# Patient Record
Sex: Female | Born: 1955 | Race: White | Hispanic: No | Marital: Single | Smoking: Current every day smoker
Health system: Southern US, Community
[De-identification: ages and names within clinical notes are randomized; demographics above are authoritative.]

## PROBLEM LIST (undated history)

## (undated) DIAGNOSIS — J439 Emphysema, unspecified: Secondary | ICD-10-CM

## (undated) DIAGNOSIS — J45909 Unspecified asthma, uncomplicated: Secondary | ICD-10-CM

## (undated) DIAGNOSIS — K219 Gastro-esophageal reflux disease without esophagitis: Secondary | ICD-10-CM

## (undated) DIAGNOSIS — F419 Anxiety disorder, unspecified: Secondary | ICD-10-CM

## (undated) DIAGNOSIS — G2581 Restless legs syndrome: Secondary | ICD-10-CM

## (undated) DIAGNOSIS — R0902 Hypoxemia: Secondary | ICD-10-CM

## (undated) DIAGNOSIS — Z860100 Personal history of colon polyps, unspecified: Secondary | ICD-10-CM

## (undated) DIAGNOSIS — J449 Chronic obstructive pulmonary disease, unspecified: Secondary | ICD-10-CM

## (undated) DIAGNOSIS — D219 Benign neoplasm of connective and other soft tissue, unspecified: Secondary | ICD-10-CM

## (undated) DIAGNOSIS — IMO0001 Reserved for inherently not codable concepts without codable children: Secondary | ICD-10-CM

## (undated) DIAGNOSIS — G709 Myoneural disorder, unspecified: Secondary | ICD-10-CM

## (undated) HISTORY — DX: Personal history of colon polyps, unspecified: Z86.0100

## (undated) HISTORY — DX: Myoneural disorder, unspecified: G70.9

## (undated) HISTORY — DX: Anxiety disorder, unspecified: F41.9

## (undated) HISTORY — DX: Benign neoplasm of connective and other soft tissue, unspecified: D21.9

## (undated) HISTORY — DX: Emphysema, unspecified: J43.9

## (undated) HISTORY — DX: Reserved for inherently not codable concepts without codable children: IMO0001

## (undated) HISTORY — DX: Hypoxemia: R09.02

## (undated) HISTORY — DX: Gastro-esophageal reflux disease without esophagitis: K21.9

## (undated) HISTORY — DX: Restless legs syndrome: G25.81

## (undated) HISTORY — DX: Chronic obstructive pulmonary disease, unspecified: J44.9

## (undated) HISTORY — DX: Unspecified asthma, uncomplicated: J45.909

## (undated) HISTORY — DX: Personal history of colonic polyps: Z86.010

---

## 1981-12-28 HISTORY — PX: TUBAL LIGATION: SHX77

## 2005-01-21 ENCOUNTER — Ambulatory Visit: Payer: Self-pay | Admitting: Gastroenterology

## 2005-02-02 ENCOUNTER — Ambulatory Visit: Payer: Self-pay | Admitting: Obstetrics and Gynecology

## 2005-07-08 ENCOUNTER — Ambulatory Visit: Payer: Self-pay

## 2006-02-16 ENCOUNTER — Ambulatory Visit: Payer: Self-pay | Admitting: Family Medicine

## 2007-02-25 ENCOUNTER — Ambulatory Visit: Payer: Self-pay | Admitting: Family Medicine

## 2007-03-01 ENCOUNTER — Ambulatory Visit: Payer: Self-pay | Admitting: Family Medicine

## 2008-03-24 ENCOUNTER — Ambulatory Visit: Payer: Self-pay | Admitting: Internal Medicine

## 2008-08-15 ENCOUNTER — Ambulatory Visit: Payer: Self-pay | Admitting: Family Medicine

## 2008-09-11 ENCOUNTER — Ambulatory Visit: Payer: Self-pay | Admitting: Family Medicine

## 2008-12-28 HISTORY — PX: CHOLECYSTECTOMY: SHX55

## 2010-05-21 ENCOUNTER — Ambulatory Visit: Payer: Self-pay | Admitting: Family Medicine

## 2010-06-18 ENCOUNTER — Ambulatory Visit: Payer: Self-pay | Admitting: Surgery

## 2011-03-03 ENCOUNTER — Ambulatory Visit: Payer: Self-pay | Admitting: Family Medicine

## 2011-03-05 ENCOUNTER — Ambulatory Visit: Payer: Self-pay | Admitting: Family Medicine

## 2011-04-22 LAB — HM COLONOSCOPY

## 2013-10-22 ENCOUNTER — Ambulatory Visit: Payer: Self-pay | Admitting: Internal Medicine

## 2014-01-31 ENCOUNTER — Institutional Professional Consult (permissible substitution): Payer: Self-pay | Admitting: Pulmonary Disease

## 2014-02-26 ENCOUNTER — Encounter: Payer: Self-pay | Admitting: Pulmonary Disease

## 2014-02-26 ENCOUNTER — Ambulatory Visit (INDEPENDENT_AMBULATORY_CARE_PROVIDER_SITE_OTHER): Payer: BC Managed Care – PPO | Admitting: Pulmonary Disease

## 2014-02-26 VITALS — BP 128/76 | Temp 98.5°F | Ht 62.0 in | Wt 152.0 lb

## 2014-02-26 DIAGNOSIS — R05 Cough: Secondary | ICD-10-CM

## 2014-02-26 DIAGNOSIS — J4489 Other specified chronic obstructive pulmonary disease: Secondary | ICD-10-CM

## 2014-02-26 DIAGNOSIS — F17200 Nicotine dependence, unspecified, uncomplicated: Secondary | ICD-10-CM

## 2014-02-26 DIAGNOSIS — J449 Chronic obstructive pulmonary disease, unspecified: Secondary | ICD-10-CM | POA: Insufficient documentation

## 2014-02-26 DIAGNOSIS — R059 Cough, unspecified: Secondary | ICD-10-CM

## 2014-02-26 DIAGNOSIS — Z720 Tobacco use: Secondary | ICD-10-CM

## 2014-02-26 MED ORDER — NICOTINE 10 MG IN INHA: 1.0000 | RESPIRATORY_TRACT | Status: DC | PRN

## 2014-02-26 NOTE — Assessment & Plan Note (Signed)
COPD, ongoing tobacco use, and postnasal drip or often contributing to her cough. The best thing she could do for herself is to quit smoking.  Plan: -Add Nasacort -Add over-the-counter decongestant -Add Milta Deiters med saline rinses -Quit smoking

## 2014-02-26 NOTE — Patient Instructions (Signed)
QUIT SMOKING! Use the nicotrol inhaler to help you quit  Use Milta Deiters Med rinses with distilled water at least twice per day using the instructions on the package. 1/2 hour after using the [facility] Med rinse, use Nasacort two puffs in each nostril once per day. Use Claritin and an over the counter decongestant (phenylephrine or pseudophed) as needed for the cough.  Use delsym at night prior to bedtime. During the day, use hard candies to try to soothe your throat Avoid clearing your throat as much as possible  Use your Spiriva and Dulera with a spacer  We will see you back in 6-8 weeks or sooner if needed

## 2014-02-26 NOTE — Assessment & Plan Note (Signed)
She and I talked about this at length today. The entire reason why she is seeing me today is because of her tobacco use. She has COPD and chronic cough which are both to her ongoing smoking. The best thing she can do to help herself is to quit smoking. However, she doesn't seem to be very motivated. She was interested in considering Nicotrol inhaler use after discussion today.  Plan: -Nicotrol inhaler -Continue to encourage her to quit

## 2014-02-26 NOTE — Assessment & Plan Note (Signed)
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 This is very concerning and is a good explanation for why she has been feeling more short of breath in the last year. I explained to her that repetitive exacerbations will lead to worsening COPD and I believe this is clearly the case.  She desperately needs to quit smoking, as this is only accelerating decline of her COPD and causing the exacerbations.  Plan: -O2 therapy: Not indicated -Immunizations: Review next visit -Tobacco use: Advised at length to quit -Exercise: Encouraged regular exercise -Bronchodilator therapy: Add a spacer for Dulera, continue Spiriva -Exacerbation prevention: Quit smoking

## 2014-02-26 NOTE — Progress Notes (Signed)
Subjective:    Patient ID: Lisa Avila, female    DOB: 06-May-1956, 58 y.o.   MRN: [id number]  HPI  Lisa Avila is here to see me for COPD.  She wa previously followed by Dr. Vella Kohler for this several years.  She has known about this for about ten year.   In the last few months she has had a lot cough at night and has been treated with multiple rounds of antibiotics since January.  As a child she had asthma which was exacerbated by pine trees.  She has never been hospitalizations.  She has some constant sinus drainage drainage.  In years past this was worse in the Spring, but lately it has been bad.  She takes claritin on an as needed basis for her sinus drainage.  She doesn't take anything else for the sinus trouble.  She rarely coughs during the day, but mostly at night.  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  When she is in the plant she gets more dyspnea after being exposed to the dust.  2014 was a rough year in terms of her breathing.  She was treated for multiple flares with prednisone and antibiotics.   In general her dyspnea has not changed much lately, just the cough.  She saw a chiropracter a few numbness in her shoulder and had an adjustment.   She continues to smoke 1.5 packs per day.  She continues has tried Chantix which made her irritable after one week of use.    She remains active despite her COPD, but she noticed more dyspnea lasat year after multiple exacerbations.    She takes ranitidine at night and doesn't have much heart burn.  Past Medical History  Diagnosis Date  . Asthma   . Emphysema lung   . COPD (chronic obstructive pulmonary disease)   . Restless leg syndrome   . History of colonic polyps   . Fibroids   . Reflux   . Anxiety      Family History  Problem Relation Age of Onset  . Emphysema Maternal Grandmother   . Lung cancer Paternal Grandfather    . Asthma Paternal Grandmother      History   Social History  . Marital Status: Single    Spouse Name: N/A    Number of Children: N/A  . Years of Education: N/A   Occupational History  . Not on file.   Social History Main Topics  . Smoking status: Former Smoker -- 1.50 packs/day for 44 years    Types: Cigarettes  . Smokeless tobacco: Never Used     Comment: tried eCig, made coughing worse.  Marland Kitchen Alcohol Use: No  . Drug Use: No  . Sexual Activity: Not on file   Other Topics Concern  . Not on file   Social History Narrative  . No narrative on file     Not on File   No outpatient prescriptions prior to visit.   No facility-administered medications prior to visit.      Review of Systems  Constitutional: Negative for fever and unexpected weight change.  HENT: Negative for congestion, dental problem, ear pain, nosebleeds, postnasal drip, rhinorrhea, sinus pressure, sneezing, sore throat and trouble swallowing.   Eyes: Negative for redness and itching.  Respiratory: Positive for cough and shortness of breath. Negative for chest tightness and wheezing.   Cardiovascular: Negative  for palpitations and leg swelling.  Gastrointestinal: Negative for nausea and vomiting.  Genitourinary: Negative for dysuria.  Musculoskeletal: Negative for joint swelling.  Skin: Negative for rash.  Neurological: Negative for headaches.  Hematological: Does not bruise/bleed easily.  Psychiatric/Behavioral: Negative for dysphoric mood. The patient is not nervous/anxious.        Objective:   Physical Exam  Filed Vitals:   02/26/14 1332  BP: 128/76  Temp: 98.5 F (36.9 C)  TempSrc: Oral  Height: 5\' 2"  (1.575 m)  Weight: 152 lb (68.947 kg)   Gen: well appearing, no acute distress HEENT: NCAT, PERRL, EOMi, OP clear, neck supple without masses PULM: CTA B CV: RRR, no mgr, no JVD AB: BS+, soft, nontender, no hsm Ext: warm, no edema, no clubbing, no cyanosis Derm: no rash or skin  breakdown Neuro: A&Ox4, CN II-XII intact, strength 5/5 in all 4 extremities       Assessment & Plan:   COPD, moderate Lisa Avila has gold grade D. COPD and that she has had more than 2 exacerbations in the last year. This is very concerning and is a good explanation for why she has been feeling more short of breath in the last year. I explained to her that repetitive exacerbations will lead to worsening COPD and I believe this is clearly the case.  She desperately needs to quit smoking, as this is only accelerating decline of her COPD and causing the exacerbations.  Plan: -O2 therapy: Not indicated -Immunizations: Review next visit -Tobacco use: Advised at length to quit -Exercise: Encouraged regular exercise -Bronchodilator therapy: Add a spacer for Dulera, continue Spiriva -Exacerbation prevention: Quit smoking   Cough COPD, ongoing tobacco use, and postnasal drip or often contributing to her cough. The best thing she could do for herself is to quit smoking.  Plan: -Add Nasacort -Add over-the-counter decongestant -Add Milta Deiters med saline rinses -Quit smoking  Tobacco abuse She and I talked about this at length today. The entire reason why she is seeing me today is because of her tobacco use. She has COPD and chronic cough which are both to her ongoing smoking. The best thing she can do to help herself is to quit smoking. However, she doesn't seem to be very motivated. She was interested in considering Nicotrol inhaler use after discussion today.  Plan: -Nicotrol inhaler -Continue to encourage her to quit    Updated Medication List Outpatient Encounter Prescriptions as of 02/26/2014  Medication Sig  . albuterol (PROVENTIL HFA;VENTOLIN HFA) 108 (90 BASE) MCG/ACT inhaler Inhale into the lungs every 6 (six) hours as needed for wheezing or shortness of breath.  . diphenhydrAMINE (SOMINEX) 25 MG tablet Take 25 mg by mouth at bedtime as needed for sleep.  Marland Kitchen loratadine (CLARITIN) 10 MG  tablet Take 10 mg by mouth daily as needed for allergies.  . mometasone-formoterol (DULERA) 100-5 MCG/ACT AERO Inhale 2 puffs into the lungs 2 (two) times daily.  Marland Kitchen tiotropium (SPIRIVA) 18 MCG inhalation capsule Place 18 mcg into inhaler and inhale daily.  Marland Kitchen venlafaxine (EFFEXOR) 75 MG tablet Take 75 mg by mouth daily.  . nicotine (NICOTROL) 10 MG inhaler Inhale 1 cartridge (1 continuous puffing total) into the lungs as needed for smoking cessation.

## 2014-02-28 ENCOUNTER — Other Ambulatory Visit: Payer: Self-pay

## 2014-02-28 MED ORDER — AEROCHAMBER MV MISC: Status: AC

## 2014-03-22 ENCOUNTER — Telehealth: Payer: Self-pay | Admitting: Pulmonary Disease

## 2014-03-22 MED ORDER — BENZONATATE 100 MG PO CAPS: 100.0000 mg | ORAL_CAPSULE | Freq: Three times a day (TID) | ORAL | Status: DC | PRN

## 2014-03-22 NOTE — Telephone Encounter (Signed)
Pt was seen by BQ on 02/26/14 with the following instructions:  Patient Instructions     QUIT SMOKING!  Use the nicotrol inhaler to help you quit  Use Milta Deiters Med rinses with distilled water at least twice per day using the instructions on the package.  1/2 hour after using the [facility] Med rinse, use Nasacort two puffs in each nostril once per day.  Use Claritin and an over the counter decongestant (phenylephrine or pseudophed) as needed for the cough.  Use delsym at night prior to bedtime.  During the day, use hard candies to try to soothe your throat  Avoid clearing your throat as much as possible  Use your Spiriva and Dulera with a spacer  We will see you back in 6-8 weeks or sooner if needed   -------------------  Called, spoke with pt.  Reports she is using flonase, sudafed, and delsym to try to help with cough but these meds aren't working.  Coughing has worsened over the past few days and is worse qhs.  She also has a tickle in the back of her throat and gags at times from coughing a lot.  No increased SOB, chest tightness, or chest pain.  Pt is requesting a prescription cough med to be called in tonight.  I offered OV for tomorrow -- pt declined stating she cannot get off of work.  Dr. Lake Bells, pls advise.  Thank you.  [facility] [place]  Note:  I did explain to pt that some cough rxs must be physically taken to the pharm.  She verbalized understanding.

## 2014-03-22 NOTE — Telephone Encounter (Signed)
Called, spoke with pt.  Informed her of below recs per Dr. Lake Bells.  She verbalized understanding of this and is aware rx was sent to [facility].  She is to call back if symptoms worsen or do not improve.

## 2014-03-22 NOTE — Telephone Encounter (Signed)
Tessalon 100mg  po tid prn cough dispense #30, refill 1

## 2014-04-17 ENCOUNTER — Ambulatory Visit (INDEPENDENT_AMBULATORY_CARE_PROVIDER_SITE_OTHER): Payer: BC Managed Care – PPO | Admitting: Adult Health

## 2014-04-17 ENCOUNTER — Telehealth: Payer: Self-pay | Admitting: Pulmonary Disease

## 2014-04-17 ENCOUNTER — Encounter: Payer: Self-pay | Admitting: Adult Health

## 2014-04-17 VITALS — BP 134/80 | HR 94 | Temp 98.6°F | Ht 62.0 in | Wt 152.4 lb

## 2014-04-17 DIAGNOSIS — J449 Chronic obstructive pulmonary disease, unspecified: Secondary | ICD-10-CM

## 2014-04-17 DIAGNOSIS — R05 Cough: Secondary | ICD-10-CM

## 2014-04-17 DIAGNOSIS — R059 Cough, unspecified: Secondary | ICD-10-CM

## 2014-04-17 MED ORDER — HYDROCODONE-HOMATROPINE 5-1.5 MG/5ML PO SYRP: 5.0000 mL | ORAL_SOLUTION | Freq: Four times a day (QID) | ORAL | Status: DC | PRN

## 2014-04-17 NOTE — Progress Notes (Signed)
I agree with the above note 

## 2014-04-17 NOTE — Telephone Encounter (Signed)
Called and spoke to pt. Pt states she still has a persistent cough with intermittent mucous production of clear mucous. Pt states she is having a hard time sleeping at night d/t cough. Pt was requesting medication but was advised to schedule appointment d/t cough not resolving and worsening. Pt agreed to schedule appointment for TP today at 2pm, pt aware of [place] office. Pt last seen by BQ on 02/26/14.

## 2014-04-17 NOTE — Assessment & Plan Note (Addendum)
Appears under control without flare -ongoing chronic cough suspect secondary to Rhinitis w/ post nasal drip /GERD/ongoing smoking Advised on smoking cessation  If cough not improving, will need a repeat chest x-ray  Plan  Delsym 2 teaspoons twice daily for cough. Tessalon 3 times daily. For cough. Chlor-Trimeton 4 mg 2 tablets at bedtime. For drainage Allegra. 180 mg daily in a.m. For drainage Use sugarless candy sips of water, and ice to help soothe, throat, and not, cough, or clear her throat. Avoid all mint products. Prilosec 20 mg daily in the morning before meal. Ranitidine at bedtime. May use Hydromet 1 teaspoon every 6 hours as  needed. For cough, may make you sleepy follow up Dr. Lake Bells in 2-3 weeks and As needed   Please contact office for sooner follow up if symptoms do not improve or worsen or seek emergency care  Most important goal is to quit smoking

## 2014-04-17 NOTE — Assessment & Plan Note (Addendum)
Cyclical cough ?GERD/AR triggers , in smoke   Plan  Delsym 2 teaspoons twice daily for cough. Tessalon 3 times daily. For cough. Chlor-Trimeton 4 mg 2 tablets at bedtime. For drainage Allegra. 180 mg daily in a.m. For drainage Use sugarless candy sips of water, and ice to help soothe, throat, and not, cough, or clear her throat. Avoid all mint products. Prilosec 20 mg daily in the morning before meal. Ranitidine at bedtime. May use Hydromet 1 teaspoon every 6 hours as  needed. For cough, may make you sleepy follow up Dr. Lake Bells in 2-3 weeks and As needed   Please contact office for sooner follow up if symptoms do not improve or worsen or seek emergency care  Most important goal is to quit smoking

## 2014-04-17 NOTE — Patient Instructions (Addendum)
Delsym 2 teaspoons twice daily for cough. Tessalon 3 times daily. For cough. Chlor-Trimeton 4 mg 2 tablets at bedtime. For drainage Allegra. 180 mg daily in a.m. For drainage Use sugarless candy sips of water, and ice to help soothe, throat, and not, cough, or clear her throat. Avoid all mint products. Prilosec 20 mg daily in the morning before meal. Ranitidine at bedtime. May use Hydromet 1 teaspoon every 6 hours as  needed. For cough, may make you sleepy follow up Dr. Lake Bells in 2-3 weeks and As needed   Please contact office for sooner follow up if symptoms do not improve or worsen or seek emergency care  Most important goal is to quit smoking

## 2014-04-17 NOTE — Progress Notes (Signed)
   Subjective:    Patient ID: Lisa Avila, female    DOB: 04-Mar-1956, 58 y.o.   MRN: [id number]  HPI 58 yo with hx of Moderate COPD (FEV1 63% , ratio 54 ) Active smoker >failed Chantix x 2 past   04/17/2014 Acute OV  Patient returns for persistent cough. Was seen last visit for management of her COPD and chronic cough. The patient was recommended to continue on Dulera and Spiriva. She was advised to use Tessalon, and Delsym for cough. Along with Claritin-D and saline nasal rinses. Patient says that her cough is no better. She coughs all during the day and at night. Has not been able to get much sleep. She denies any wheezing, shortness, of breath, orthopnea, PND, or discolored mucus. Patient says she is very active, and shortness, of breath. Has not increased. She continues to smoke, smoking cessation was discussed. She says she is trying to cut down but has not quit yet. Continues to have quite a bit of postnasal drip. Throat clearing and sensation of something stuck in her throat. She denies any hemoptysis, or dysphagia. Says she has had a recent chest x-ray that was reported to her as normal.    Review of Systems Constitutional:   No  weight loss, night sweats,  Fevers, chills, fatigue, or  lassitude.  HEENT:   No headaches,  Difficulty swallowing,  Tooth/dental problems, or  Sore throat,                No sneezing, itching, ear ache,  +nasal congestion, post nasal drip,   CV:  No chest pain,  Orthopnea, PND, swelling in lower extremities, anasarca, dizziness, palpitations, syncope.   GI  No heartburn, indigestion, abdominal pain, nausea, vomiting, diarrhea, change in bowel habits, loss of appetite, bloody stools.   Resp: No shortness of breath with exertion or at rest.  No excess mucus,    No coughing up of blood.  No change in color of mucus.  No wheezing.  No chest wall deformity  Skin: no rash or lesions.  GU: no dysuria, change in color of urine, no urgency or frequency.  No  flank pain, no hematuria   MS:  No joint pain or swelling.  No decreased range of motion.  No back pain.  Psych:  No change in mood or affect. No depression or anxiety.  No memory loss.         Objective:   Physical Exam GEN: A/Ox3; pleasant , NAD, well nourished , barking cough   HEENT:  [place]/AT,  EACs-clear, TMs-wnl, NOSE-clear, THROAT-clear, no lesions, no postnasal drip or exudate noted.   NECK:  Supple w/ fair ROM; no JVD; normal carotid impulses w/o bruits; no thyromegaly or nodules palpated; no lymphadenopathy.  RESP  Clear  P & A; w/o, wheezes/ rales/ or rhonchi.no accessory muscle use, no dullness to percussion  CARD:  RRR, no m/r/g  , no peripheral edema, pulses intact, no cyanosis or clubbing.  GI:   Soft & nt; nml bowel sounds; no organomegaly or masses detected.  Musco: Warm bil, no deformities or joint swelling noted.   Neuro: alert, no focal deficits noted.    Skin: Warm, no lesions or rashes         Assessment & Plan:

## 2014-05-02 ENCOUNTER — Encounter: Payer: Self-pay | Admitting: Pulmonary Disease

## 2014-05-02 ENCOUNTER — Ambulatory Visit (INDEPENDENT_AMBULATORY_CARE_PROVIDER_SITE_OTHER): Payer: BC Managed Care – PPO | Admitting: Pulmonary Disease

## 2014-05-02 VITALS — BP 114/62 | HR 89 | Ht 62.0 in | Wt 150.0 lb

## 2014-05-02 DIAGNOSIS — Z720 Tobacco use: Secondary | ICD-10-CM

## 2014-05-02 DIAGNOSIS — J449 Chronic obstructive pulmonary disease, unspecified: Secondary | ICD-10-CM

## 2014-05-02 DIAGNOSIS — R059 Cough, unspecified: Secondary | ICD-10-CM

## 2014-05-02 DIAGNOSIS — R05 Cough: Secondary | ICD-10-CM

## 2014-05-02 DIAGNOSIS — F17200 Nicotine dependence, unspecified, uncomplicated: Secondary | ICD-10-CM

## 2014-05-02 NOTE — Patient Instructions (Signed)
Stop clortrimeton > if you notice more nighttime symptoms of drainage and cough then use more saline rinses If you are still sleepy after this then stop the allegra and use generic zyrtec instead Keep taking your inhalers as you are doing  We will see you back in 4-6 months or sooner if needed

## 2014-05-02 NOTE — Assessment & Plan Note (Signed)
Advised at length to quit 

## 2014-05-02 NOTE — Assessment & Plan Note (Addendum)
She has gold grade C disease based on her frequent exacerbations.  I explained to her that this is a high-risk feature and she should take this is reason to quit smoking.  Plan: -Educated at length to quit smoking, see below -Continue Spiriva and Dulera -Followup 6 months

## 2014-05-02 NOTE — Progress Notes (Signed)
Subjective:    Patient ID: Lisa Avila, female    DOB: 1956-09-24, 58 y.o.   MRN: [id number]  Synopsis: First saw [facility] in 2015 for GOLD Grade D COPD active smoker with frequent exacerbations April 2012 full pulmonary function test from the [facility]> ratio 54%, FEV1 1.50 L (63% predicted, -1% change with bronchodilator), total lung capacity 4.40 L (97% predicted) DLCO 11.3 (55% predicted)  HPI  05/02/2014 ROV > She saw Tammy a few weeks ago for the cough which has no improved significantly.  She has been active in the yard.  She has been rally sleepy lately.  She thinks that she has been sleeping OK lately.  She has restless legs syndrome and is not currently taking anything for it.  Requip made her nauseated.   She had a sleep study at some point but no sleep apnea.  She has taken the hydromet a few times but hasn't used it in the last week.  Breathing is doing OK, not having trouble.  Still smoking.   Past Medical History  Diagnosis Date  . Asthma   . Emphysema lung   . COPD (chronic obstructive pulmonary disease)   . Restless leg syndrome   . History of colonic polyps   . Fibroids   . Reflux   . Anxiety      Review of Systems  Constitutional: Negative for fever, chills and fatigue.  HENT: Negative for postnasal drip, rhinorrhea and sinus pressure.   Respiratory: Negative for cough, shortness of breath and wheezing.   Cardiovascular: Negative for chest pain, palpitations and leg swelling.       Objective:   Physical Exam Filed Vitals:   05/02/14 1042  BP: 114/62  Pulse: 89  Height: 5\' 2"  (1.575 m)  Weight: 150 lb (68.04 kg)  SpO2: 100%   RA  Gen: well appearing, no acute distress HEENT: NCAT, EOMi, OP clear,  PULM: CTA B CV: RRR, no mgr, no JVD AB: BS+, soft, nontender, no hsm Ext: warm, no edema, no clubbing, no cyanosis Derm: no rash or skin breakdown Neuro: A&Ox4, MAEW       Assessment & Plan:   COPD GOLD C She has gold grade C disease  based on her frequent exacerbations.  I explained to her that this is a high-risk feature and she should take this is reason to quit smoking.  Plan: -Educated at length to quit smoking, see below -Continue Spiriva and Dulera -Followup 6 months  Tobacco abuse Advised at length to quit  Cough Result The chlorpheniramine is making her sleepy so advised her to stop.      Updated Medication List Outpatient Encounter Prescriptions as of 05/02/2014  Medication Sig  . albuterol (PROVENTIL HFA;VENTOLIN HFA) 108 (90 BASE) MCG/ACT inhaler Inhale into the lungs every 6 (six) hours as needed for wheezing or shortness of breath.  . chlorpheniramine (CHLOR-TRIMETON) 4 MG tablet Take 4 mg by mouth every 6 (six) hours as needed for allergies.  Marland Kitchen DM-Phenylephrine-Acetaminophen (ALKA-SELTZER PLS SINUS & COUGH PO) Take 2 tablets by mouth every 4 (four) hours as needed.  Marland Kitchen HYDROcodone-homatropine (HYDROMET) 5-1.5 MG/5ML syrup Take 5 mLs by mouth every 6 (six) hours as needed for cough.  . loratadine (CLARITIN) 10 MG tablet Take 10 mg by mouth daily as needed for allergies.  . mometasone-formoterol (DULERA) 100-5 MCG/ACT AERO Inhale 2 puffs into the lungs 2 (two) times daily.  . nicotine (NICOTROL) 10 MG inhaler Inhale 1 cartridge (1 continuous puffing total) into  the lungs as needed for smoking cessation.  Marland Kitchen Spacer/Aero-Holding Chambers (AEROCHAMBER MV) inhaler Use as instructed to aid with inhaled medications.  Marland Kitchen tiotropium (SPIRIVA) 18 MCG inhalation capsule Place 18 mcg into inhaler and inhale daily.  Marland Kitchen venlafaxine (EFFEXOR) 75 MG tablet Take 75 mg by mouth daily.  . [DISCONTINUED] benzonatate (TESSALON) 100 MG capsule Take 1 capsule (100 mg total) by mouth 3 (three) times daily as needed for cough.

## 2014-05-02 NOTE — Assessment & Plan Note (Signed)
Result The chlorpheniramine is making her sleepy so advised her to stop.

## 2014-07-23 ENCOUNTER — Telehealth: Payer: Self-pay | Admitting: Pulmonary Disease

## 2014-07-23 MED ORDER — BENZONATATE 200 MG PO CAPS: 200.0000 mg | ORAL_CAPSULE | Freq: Three times a day (TID) | ORAL | Status: DC | PRN

## 2014-07-23 NOTE — Telephone Encounter (Signed)
Pt states she is having a dry nighttime cough for about 2-3 weeks now; pt denies any SOB, wheezing, fever or chills. Pt started back on Chlortimeton every night and Allegra every morning. Pt would like to have rx for cough called/sent to pharmacy-pt does not want any cough syrup that requires her to come pick up rx from [place] office. BQ please advise. Thanks.

## 2014-07-23 NOTE — Telephone Encounter (Signed)
BQ called the office and suggests that we give her Tessalon 200 mg #30 take 1 po every 8 hours prn with 1 refill.   Pt aware of Rx and aware I have sent to pharmacy. Nothing more needed at this time.

## 2014-11-13 ENCOUNTER — Encounter: Payer: Self-pay | Admitting: Pulmonary Disease

## 2014-11-13 ENCOUNTER — Ambulatory Visit (INDEPENDENT_AMBULATORY_CARE_PROVIDER_SITE_OTHER): Payer: BC Managed Care – PPO | Admitting: Pulmonary Disease

## 2014-11-13 VITALS — BP 126/78 | HR 93 | Ht 62.0 in | Wt 151.0 lb

## 2014-11-13 DIAGNOSIS — Z720 Tobacco use: Secondary | ICD-10-CM

## 2014-11-13 DIAGNOSIS — J449 Chronic obstructive pulmonary disease, unspecified: Secondary | ICD-10-CM

## 2014-11-13 DIAGNOSIS — Z23 Encounter for immunization: Secondary | ICD-10-CM

## 2014-11-13 NOTE — Assessment & Plan Note (Signed)
We had a lengthy conversation about quitting smoking and techniques she can use to try to quit.  She has smoked more than one pack of cigarettes daily for greater than 30 years and she is age 58. She qualifies for lung cancer screening. Today we talked about the high false positive rate and the potential benefits of low-dose cancer screening. She is willing to proceed.  Plan: -Refer to the [place] lung cancer screening program

## 2014-11-13 NOTE — Progress Notes (Signed)
Subjective:    Patient ID: Lisa Avila, female    DOB: 1956/04/13, 58 y.o.   MRN: [id number]  Synopsis: First saw [facility] in 2015 for GOLD Grade D COPD active smoker with frequent exacerbations April 2012 full pulmonary function test from the [facility]> ratio 54%, FEV1 1.50 L (63% predicted, -1% change with bronchodilator), total lung capacity 4.40 L (97% predicted) DLCO 11.3 (55% predicted)  HPI  Chief Complaint  Patient presents with  . Follow-up    Pt has "good and bad days", nonprod cough. CAT score 17/   11/13/2014 ROV> Alethea says that she has been ding OK recently except when there was a lot of rain.  She had more chest tightness then.  Her breathing has been OK lately.  She still smokes 1.5 ppd of cigarettes.  It is less when she works.  She had been treated with prednisone once since the last visit for dyspnea and a flare of COPD.  She has been taking hydrocodone on occasion to help with cough and sleep. She uses the albuterol only very rarely, she used it more when it was raining lately.   Past Medical History  Diagnosis Date  . Asthma   . Emphysema lung   . COPD (chronic obstructive pulmonary disease)   . Restless leg syndrome   . History of colonic polyps   . Fibroids   . Reflux   . Anxiety      Review of Systems  Constitutional: Negative for fever, chills and fatigue.  HENT: Negative for postnasal drip, rhinorrhea and sinus pressure.   Respiratory: Negative for cough, shortness of breath and wheezing.   Cardiovascular: Negative for chest pain, palpitations and leg swelling.       Objective:   Physical Exam  Filed Vitals:   11/13/14 1132  BP: 126/78  Pulse: 93  Height: 5\' 2"  (1.575 m)  Weight: 151 lb (68.493 kg)  SpO2: 97%   RA  Gen: well appearing, no acute distress HEENT: NCAT, EOMi, OP clear,  PULM: CTA B CV: RRR, no mgr, no JVD AB: BS+, soft, nontender, no hsm Ext: warm, no edema, no clubbing, no cyanosis Derm: no rash or skin  breakdown Neuro: A&Ox4, MAEW       Assessment & Plan:   COPD GOLD C Maretta continues to smoke and have exacerbations of COPD multiple times per year. I explained to her at length today that these are both high-risk features for her severe COPD. We are at a point in her life where if she could quit smoking she could radically change her prognosis. Unfortunately she continues to smoke. Today is not the first time that I've had this conversation with her.  Plan: -Quit smoking next-continue Spiriva and Dulera -Albuterol as needed -Flu shot today, Prevnar vaccine today -Followup 6 months  Tobacco abuse We had a lengthy conversation about quitting smoking and techniques she can use to try to quit.  She has smoked more than one pack of cigarettes daily for greater than 30 years and she is age 74. She qualifies for lung cancer screening. Today we talked about the high false positive rate and the potential benefits of low-dose cancer screening. She is willing to proceed.  Plan: -Refer to the [facility] lung cancer screening program    Updated Medication List Outpatient Encounter Prescriptions as of 11/13/2014  Medication Sig  . albuterol (PROVENTIL HFA;VENTOLIN HFA) 108 (90 BASE) MCG/ACT inhaler Inhale into the lungs every 6 (six) hours as needed for  wheezing or shortness of breath.  . fexofenadine (ALLEGRA) 180 MG tablet Take 180 mg by mouth daily.  Marland Kitchen HYDROcodone-homatropine (HYDROMET) 5-1.5 MG/5ML syrup Take 5 mLs by mouth every 6 (six) hours as needed for cough.  . mometasone-formoterol (DULERA) 100-5 MCG/ACT AERO Inhale 2 puffs into the lungs 2 (two) times daily.  Marland Kitchen Spacer/Aero-Holding Chambers (AEROCHAMBER MV) inhaler Use as instructed to aid with inhaled medications.  Marland Kitchen tiotropium (SPIRIVA) 18 MCG inhalation capsule Place 18 mcg into inhaler and inhale daily.  Marland Kitchen venlafaxine (EFFEXOR) 75 MG tablet Take 75 mg by mouth daily.  . [DISCONTINUED] benzonatate (TESSALON) 200 MG capsule Take  1 capsule (200 mg total) by mouth every 8 (eight) hours as needed for cough.  . [DISCONTINUED] chlorpheniramine (CHLOR-TRIMETON) 4 MG tablet Take 4 mg by mouth every 6 (six) hours as needed for allergies.  . [DISCONTINUED] DM-Phenylephrine-Acetaminophen (ALKA-SELTZER PLS SINUS & COUGH PO) Take 2 tablets by mouth every 4 (four) hours as needed.  . [DISCONTINUED] loratadine (CLARITIN) 10 MG tablet Take 10 mg by mouth daily as needed for allergies.  . [DISCONTINUED] nicotine (NICOTROL) 10 MG inhaler Inhale 1 cartridge (1 continuous puffing total) into the lungs as needed for smoking cessation.

## 2014-11-13 NOTE — Addendum Note (Signed)
Addended by: Len Blalock on: 11/13/2014 01:16 PM   Modules accepted: Orders

## 2014-11-13 NOTE — Assessment & Plan Note (Signed)
Lisa Avila continues to smoke and have exacerbations of COPD multiple times per year. I explained to her at length today that these are both high-risk features for her severe COPD. We are at a point in her life where if she could quit smoking she could radically change her prognosis. Unfortunately she continues to smoke. Today is not the first time that I've had this conversation with her.  Plan: -Quit smoking next-continue Spiriva and Dulera -Albuterol as needed -Flu shot today, Prevnar vaccine today -Followup 6 months

## 2014-11-13 NOTE — Patient Instructions (Signed)
WE will refer you to the lung cancer screening program at [facility] Quit smoking Use your medications as written We will see you back in 6 months

## 2014-12-10 ENCOUNTER — Telehealth: Payer: Self-pay | Admitting: Pulmonary Disease

## 2014-12-10 MED ORDER — TRAMADOL HCL 50 MG PO TABS: 50.0000 mg | ORAL_TABLET | Freq: Four times a day (QID) | ORAL | Status: DC | PRN

## 2014-12-10 NOTE — Telephone Encounter (Signed)
Tramadol 50mg  q6h prn cough dispense #20 no refills, don't take and drive or with other pain medications Tell her to quit smoking

## 2014-12-10 NOTE — Telephone Encounter (Signed)
Spoke with the pt and notified of recs per McQuaid  Pt verbalized understanding  Rx was sent to pharm

## 2014-12-10 NOTE — Telephone Encounter (Signed)
Called and spoke to pt. Pt c/o cough with intermittent mucus production- clear in color, shoulder and back soreness from coughing and insomnia secondary to cough x 3 days. Pt is requesting a cough suppressant. Pt stated tessalon did not help. Pt denies SOB, CP/tightness and f/c/s. Pt is currently taking allegra and benadryl. Pt last seen on 11/13/14 by BQ.   BQ please advise.  No Known Allergies

## 2015-01-08 ENCOUNTER — Telehealth: Payer: Self-pay | Admitting: *Deleted

## 2015-01-08 NOTE — Telephone Encounter (Signed)
Refill request received from [facility] for tramadol.  LOV 11/13/14; no OV scheduled.  Ok to refill?

## 2015-01-09 MED ORDER — MOMETASONE FURO-FORMOTEROL FUM 100-5 MCG/ACT IN AERO: 2.0000 | INHALATION_SPRAY | Freq: Two times a day (BID) | RESPIRATORY_TRACT | Status: DC

## 2015-01-09 MED ORDER — TIOTROPIUM BROMIDE MONOHYDRATE 18 MCG IN CAPS: 18.0000 ug | ORAL_CAPSULE | Freq: Every day | RESPIRATORY_TRACT | Status: DC

## 2015-01-09 NOTE — Telephone Encounter (Signed)
No needs office visit Needs to quit smoking

## 2015-01-09 NOTE — Telephone Encounter (Signed)
Pt is aware that we can not refill her medication at this time. OV has been scheduled for 01/31/15 at 2:30pm. She requested samples of Dulera and Spiriva. Having issues with her insurance and medications are going to cost over $600. Samples will be taken to [facility] today for her to pick up.

## 2015-01-31 ENCOUNTER — Ambulatory Visit (INDEPENDENT_AMBULATORY_CARE_PROVIDER_SITE_OTHER): Payer: BLUE CROSS/BLUE SHIELD | Admitting: Pulmonary Disease

## 2015-01-31 ENCOUNTER — Encounter (INDEPENDENT_AMBULATORY_CARE_PROVIDER_SITE_OTHER): Payer: Self-pay

## 2015-01-31 ENCOUNTER — Encounter: Payer: Self-pay | Admitting: Pulmonary Disease

## 2015-01-31 VITALS — BP 134/74 | HR 101 | Ht 62.0 in | Wt 149.0 lb

## 2015-01-31 DIAGNOSIS — J449 Chronic obstructive pulmonary disease, unspecified: Secondary | ICD-10-CM

## 2015-01-31 DIAGNOSIS — Z720 Tobacco use: Secondary | ICD-10-CM

## 2015-01-31 DIAGNOSIS — R059 Cough, unspecified: Secondary | ICD-10-CM

## 2015-01-31 DIAGNOSIS — R05 Cough: Secondary | ICD-10-CM

## 2015-01-31 MED ORDER — TRAMADOL HCL 50 MG PO TABS: 50.0000 mg | ORAL_TABLET | Freq: Four times a day (QID) | ORAL | Status: DC | PRN

## 2015-01-31 NOTE — Patient Instructions (Signed)
Ask your insurance company for the formulary We will order a screening CT scan of your lungs We will see you back in 4-6 months or sooner if needed

## 2015-01-31 NOTE — Assessment & Plan Note (Signed)
Unfortunately Lisa Avila continues to smoke. She does okay with long as she is taking her inhalers but I fear that eventually she is going to become more symptomatic despite medication. She's been frustrated by the high cost of her medications.  Plan: -Quit smoking -Continue current inhaled regimen, I advised her to let us know if there is an alternative agent her insurance formulary would prefer her to use -Follow-up 6 months

## 2015-01-31 NOTE — Assessment & Plan Note (Signed)
She has a greater than 30-pack-year smoking history and his older than 51 and is still actively smoking so therefore she qualifies for lung cancer screening. Today I discussed the risks and benefits of this including the high false positive rate. She is willing to proceed. We will make a recommendation that she be referred to the lung cancer screening program at [facility].

## 2015-01-31 NOTE — Progress Notes (Signed)
Subjective:    Patient ID: Lisa Avila, female    DOB: [birth year], 59 y.o.   MRN: [id number]  Synopsis: First saw [facility] in 2015 for GOLD Grade D COPD active smoker with frequent exacerbations April 2012 full pulmonary function test from the [facility]> ratio 54%, FEV1 1.50 L (63% predicted, -1% change with bronchodilator), total lung capacity 4.40 L (97% predicted) DLCO 11.3 (55% predicted)  HPI Chief Complaint  Patient presents with  . Follow-up    Pt c/o occasional sob with exertion.  Pt is paying over $700/month for meds, wants to see about alternative meds.    Shayne says that her breathing has had its ups and downs in the last few weeks with all the change in the weather.  If the heat is on she coughs more without mucus production.  She denies increased chest congestion but has had worsening wheezing since she stopped taking the [facility].  She can't afford her inhalers.    Past Medical History  Diagnosis Date  . Asthma   . Emphysema lung   . COPD (chronic obstructive pulmonary disease)   . Restless leg syndrome   . History of colonic polyps   . Fibroids   . Reflux   . Anxiety      Review of Systems  Constitutional: Negative for fever, chills and fatigue.  HENT: Negative for postnasal drip, rhinorrhea and sinus pressure.   Respiratory: Positive for cough. Negative for shortness of breath and wheezing.   Cardiovascular: Negative for chest pain, palpitations and leg swelling.       Objective:   Physical Exam Filed Vitals:   01/31/15 1446  BP: 134/74  Pulse: 101  Height: 5\' 2"  (1.575 m)  Weight: 149 lb (67.586 kg)  SpO2: 96%   RA  Gen: well appearing, no acute distress HEENT: NCAT, EOMi, OP clear,  PULM: CTA B CV: RRR, no mgr, no JVD AB: BS+, soft, nontender,  Ext: warm, no edema, no clubbing, no cyanosis Derm: no rash or skin breakdown Neuro: A&Ox4, MAEW       Assessment & Plan:   COPD GOLD C Unfortunately Lisa Avila continues to smoke.  She does okay with long as she is taking her inhalers but I fear that eventually she is going to become more symptomatic despite medication. She's been frustrated by the high cost of her medications.  Plan: -Quit smoking -Continue current inhaled regimen, I advised her to let us know if there is an alternative agent her insurance formulary would prefer her to use -Follow-up 6 months   Tobacco abuse She has a greater than 30-pack-year smoking history and his older than 39 and is still actively smoking so therefore she qualifies for lung cancer screening. Today I discussed the risks and benefits of this including the high false positive rate. She is willing to proceed. We will make a recommendation that she be referred to the lung cancer screening program at [facility].   Cough Quit smoking     Updated Medication List Outpatient Encounter Prescriptions as of 01/31/2015  Medication Sig  . albuterol (PROVENTIL HFA;VENTOLIN HFA) 108 (90 BASE) MCG/ACT inhaler Inhale into the lungs every 6 (six) hours as needed for wheezing or shortness of breath.  . fexofenadine (ALLEGRA) 180 MG tablet Take 180 mg by mouth daily.  . mometasone-formoterol (DULERA) 100-5 MCG/ACT AERO Inhale 2 puffs into the lungs 2 (two) times daily.  Marland Kitchen Spacer/Aero-Holding Chambers (AEROCHAMBER MV) inhaler Use as instructed to aid with  inhaled medications.  Marland Kitchen tiotropium (SPIRIVA) 18 MCG inhalation capsule Place 1 capsule (18 mcg total) into inhaler and inhale daily.  Marland Kitchen venlafaxine (EFFEXOR) 75 MG tablet Take 75 mg by mouth daily.  Marland Kitchen HYDROcodone-homatropine (HYDROMET) 5-1.5 MG/5ML syrup Take 5 mLs by mouth every 6 (six) hours as needed for cough. (Patient not taking: Reported on 01/31/2015)  . traMADol (ULTRAM) 50 MG tablet Take 1 tablet (50 mg total) by mouth every 6 (six) hours as needed.  . [DISCONTINUED] traMADol (ULTRAM) 50 MG tablet Take 1 tablet (50 mg total) by mouth every 6 (six) hours as needed.  (Patient not taking: Reported on 01/31/2015)  . [DISCONTINUED] traMADol (ULTRAM) 50 MG tablet Take 1 tablet (50 mg total) by mouth every 6 (six) hours as needed.

## 2015-01-31 NOTE — Assessment & Plan Note (Signed)
Quit smoking. 

## 2015-03-07 ENCOUNTER — Ambulatory Visit
Admit: 2015-03-07 | Disposition: A | Discharge status: Home / Self Care | Payer: Self-pay | Attending: Family Medicine | Admitting: Family Medicine

## 2015-03-08 ENCOUNTER — Ambulatory Visit
Admit: 2015-03-08 | Disposition: A | Discharge status: Home / Self Care | Payer: Self-pay | Attending: Family Medicine | Admitting: Family Medicine

## 2015-03-08 ENCOUNTER — Ambulatory Visit: Payer: Self-pay | Admitting: Family Medicine

## 2015-03-29 ENCOUNTER — Ambulatory Visit
Admit: 2015-03-29 | Disposition: A | Discharge status: Home / Self Care | Payer: Self-pay | Attending: Family Medicine | Admitting: Family Medicine

## 2015-10-29 ENCOUNTER — Other Ambulatory Visit: Payer: Self-pay

## 2015-10-29 MED ORDER — MOMETASONE FURO-FORMOTEROL FUM 100-5 MCG/ACT IN AERO: 2.0000 | INHALATION_SPRAY | Freq: Two times a day (BID) | RESPIRATORY_TRACT | Status: DC

## 2016-03-30 ENCOUNTER — Telehealth: Payer: Self-pay | Admitting: *Deleted

## 2016-03-30 NOTE — Telephone Encounter (Signed)
Left voicemail for patient notifyng them that it is time to schedule annual low dose lung cancer screening CT scan. Instructed patient to call back to verify information prior to the scan being scheduled.  

## 2016-04-20 ENCOUNTER — Telehealth: Payer: Self-pay | Admitting: *Deleted

## 2016-04-20 NOTE — Telephone Encounter (Signed)
Left voicemail notifying patient it is time for recommended follow up lung cancer screening Ct scan. Requested patient to call back to verify information prior to scan being scheduled.

## 2016-04-20 NOTE — Telephone Encounter (Signed)
Notified patient that 1 year follow up  lung cancer screening low dose CT scan is due. Confirmed that patient is within age range of 55-77, asymptomatic of lung cancer, and no other serious disease processes that would make treatment of lung cancer not possible. 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 She feels well.  The patient is a  current smoker  with a 61 pack/ year history. The shared decision making visit was completed 03/08/15. The patient is agreeable for CT scan to be scheduled.

## 2016-04-20 NOTE — Telephone Encounter (Signed)
New Insurance will not start until June 1st 2017. Pt does not want to do/or schedule  low dose CT scan for lung cancer screening until after that first week in June,

## 2016-08-11 ENCOUNTER — Ambulatory Visit
Admission: EM | Admit: 2016-08-11 | Discharge: 2016-08-11 | Disposition: A | Discharge status: Home / Self Care | Payer: Worker's Compensation | Attending: Family Medicine | Admitting: Family Medicine

## 2016-08-11 ENCOUNTER — Encounter: Payer: Self-pay | Admitting: Emergency Medicine

## 2016-08-11 ENCOUNTER — Emergency Department
Admission: EM | Admit: 2016-08-11 | Discharge: 2016-08-11 | Disposition: A | Discharge status: Home / Self Care | Payer: Worker's Compensation | Attending: Emergency Medicine | Admitting: Emergency Medicine

## 2016-08-11 DIAGNOSIS — S51012A Laceration without foreign body of left elbow, initial encounter: Secondary | ICD-10-CM | POA: Insufficient documentation

## 2016-08-11 DIAGNOSIS — S5002XA Contusion of left elbow, initial encounter: Secondary | ICD-10-CM | POA: Diagnosis not present

## 2016-08-11 DIAGNOSIS — Y990 Civilian activity done for income or pay: Secondary | ICD-10-CM | POA: Diagnosis not present

## 2016-08-11 DIAGNOSIS — Y929 Unspecified place or not applicable: Secondary | ICD-10-CM | POA: Insufficient documentation

## 2016-08-11 DIAGNOSIS — Z79899 Other long term (current) drug therapy: Secondary | ICD-10-CM | POA: Diagnosis not present

## 2016-08-11 DIAGNOSIS — S51012D Laceration without foreign body of left elbow, subsequent encounter: Secondary | ICD-10-CM

## 2016-08-11 DIAGNOSIS — F17210 Nicotine dependence, cigarettes, uncomplicated: Secondary | ICD-10-CM | POA: Insufficient documentation

## 2016-08-11 DIAGNOSIS — S59902A Unspecified injury of left elbow, initial encounter: Secondary | ICD-10-CM | POA: Diagnosis present

## 2016-08-11 DIAGNOSIS — J45909 Unspecified asthma, uncomplicated: Secondary | ICD-10-CM | POA: Insufficient documentation

## 2016-08-11 DIAGNOSIS — J449 Chronic obstructive pulmonary disease, unspecified: Secondary | ICD-10-CM | POA: Diagnosis not present

## 2016-08-11 DIAGNOSIS — Z23 Encounter for immunization: Secondary | ICD-10-CM | POA: Diagnosis not present

## 2016-08-11 DIAGNOSIS — W268XXA Contact with other sharp object(s), not elsewhere classified, initial encounter: Secondary | ICD-10-CM | POA: Diagnosis not present

## 2016-08-11 DIAGNOSIS — Y9389 Activity, other specified: Secondary | ICD-10-CM | POA: Diagnosis not present

## 2016-08-11 MED ORDER — LIDOCAINE-EPINEPHRINE (PF) 1 %-1:200000 IJ SOLN
10.0000 mL | Freq: Once | INTRAMUSCULAR | Status: AC
  Administered 2016-08-11: 10 mL
  Filled 2016-08-11: qty 30

## 2016-08-11 MED ORDER — TETANUS-DIPHTH-ACELL PERTUSSIS 5-2.5-18.5 LF-MCG/0.5 IM SUSP
0.5000 mL | Freq: Once | INTRAMUSCULAR | Status: AC
  Administered 2016-08-11: 0.5 mL via INTRAMUSCULAR
  Filled 2016-08-11: qty 0.5

## 2016-08-11 NOTE — ED Provider Notes (Signed)
[facility] ____________________________________________  Time seen: Approximately 7:34 PM  I have reviewed the triage vital signs and the nursing notes.   HISTORY  Chief Complaint Laceration (WC Laceration that was stitched up at [facility] ED)   HPI Lisa Avila is a 60 y.o. female presents for reevaluation of left posterior elbow laceration. Patient reports laceration occurred last Anaprox May 11 PM at work. 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 Patient reports that she was carrying a piece of metal to replace that were needed to be, and reports she had to turn the piece of round and in this movement her left elbow was cut. Denies fall or other injury. Denies head injury or loss consciousness.  Patient reports that she was seen in the emergency room [facility] last night for laceration repair. Patient states the laceration repair was repaired approximately 4 AM.  Patient reports overall her elbow seems to be doing well but reports she does still have some pain at laceration site as well as swelling. Patient reports she is here as her work encourage her to be reevaluated as to when she can fully return to work as she reports that the ER stated that she could return to work without restrictions tomorrow.  Patient reports left elbow wound has bled minimally. States mild pain at this time. States is taking over-the-counter Tylenol which helped. Patient states pain is primarily with flexion. Reports swelling has continued. Denies other complaints. Reports her tetanus immunization was updated last night. Reports right hand dominant.   Past Medical History:  Diagnosis Date  . Anxiety   . Asthma   . COPD (chronic obstructive pulmonary disease) ([place])   . Emphysema lung ([place])   . Fibroids   . History of colonic polyps   . Reflux   . Restless leg syndrome     Patient Active Problem List   Diagnosis Date Noted  . COPD GOLD C 02/26/2014  . Cough 02/26/2014  .  Tobacco abuse 02/26/2014    Past Surgical History:  Procedure Laterality Date  . CHOLECYSTECTOMY  2010  . TUBAL LIGATION  1983   No current facility-administered medications for this encounter.   Current Outpatient Prescriptions:  .  albuterol (PROVENTIL HFA;VENTOLIN HFA) 108 (90 BASE) MCG/ACT inhaler, Inhale into the lungs every 6 (six) hours as needed for wheezing or shortness of breath., Disp: , Rfl:  .  fexofenadine (ALLEGRA) 180 MG tablet, Take 180 mg by mouth daily., Disp: , Rfl:  .  HYDROcodone-homatropine (HYDROMET) 5-1.5 MG/5ML syrup, Take 5 mLs by mouth every 6 (six) hours as needed for cough. (Patient not taking: Reported on 01/31/2015), Disp: 240 mL, Rfl: 0 .  mometasone-formoterol (DULERA) 100-5 MCG/ACT AERO, Inhale 2 puffs into the lungs 2 (two) times daily., Disp: 1 Inhaler, Rfl: 3 .  Spacer/Aero-Holding Chambers (AEROCHAMBER MV) inhaler, Use as instructed to aid with inhaled medications., Disp: 1 each, Rfl: 0 .  tiotropium (SPIRIVA) 18 MCG inhalation capsule, Place 1 capsule (18 mcg total) into inhaler and inhale daily., Disp: 10 capsule, Rfl: 0 .  traMADol (ULTRAM) 50 MG tablet, Take 1 tablet (50 mg total) by mouth every 6 (six) hours as needed., Disp: 20 tablet, Rfl: 0 .  venlafaxine (EFFEXOR) 75 MG tablet, Take 75 mg by mouth daily., Disp: , Rfl:   Allergies Review of patient's allergies indicates no known allergies.  Family History  Problem Relation Age of Onset  . Emphysema Maternal Grandmother   . Lung cancer Paternal Grandfather   .  Asthma Paternal Grandmother     Social History Social History  Substance Use Topics  . Smoking status: Current Some Day Smoker    Packs/day: 1.50    Years: 44.00    Types: Cigarettes  . Smokeless tobacco: Never Used     Comment: only smokes .5ppd on days she works  . Alcohol use No    Review of Systems Constitutional: No fever/chills Eyes: No visual changes. ENT: No sore throat. Cardiovascular: Denies chest  pain. Respiratory: Denies shortness of breath. Gastrointestinal: No abdominal pain.  No nausea, no vomiting.  No diarrhea.  No constipation. Genitourinary: Negative for dysuria. Musculoskeletal: Negative for back pain. Skin: Negative for rash. Neurological: Negative for headaches, focal weakness or numbness.  10-point ROS otherwise negative.  ____________________________________________   PHYSICAL EXAM:  VITAL SIGNS: ED Triage Vitals  Enc Vitals Group     BP 08/11/16 1851 127/81     Pulse Rate 08/11/16 1851 85     Resp 08/11/16 1851 18     Temp 08/11/16 1851 98.1 F (36.7 C)     Temp Source 08/11/16 1851 Oral     SpO2 08/11/16 1851 100 %     Weight 08/11/16 1850 120 lb (54.4 kg)     Height 08/11/16 1850 5\' 2"  (1.575 m)     Head Circumference --      Peak Flow --      Pain Score 08/11/16 1851 0     Pain Loc --      Pain Edu? --      Excl. in [place]? --     Constitutional: Alert and oriented. Well appearing and in no acute distress. Eyes: Conjunctivae are normal. PERRL. EOMI. ENT      Head: Normocephalic and atraumatic. Cardiovascular: Normal rate, regular rhythm. Grossly normal heart sounds.  Good peripheral circulation. Respiratory: Normal respiratory effort without tachypnea nor retractions. Breath sounds are clear and equal bilaterally. No wheezes/rales/rhonchi.. Musculoskeletal:  Nontender with normal range of motion in all extremities. Except see skin below. Neurologic:  Normal speech and language. No gross focal neurologic deficits are appreciated. Speech is normal. No gait instability.  Skin:  Skin is warm, dry and intact. No rash noted. Except :  Left posterior elbow 5 cm laceration present with 7 sutures. Wound is well approximated, minimal bleeding present when patient flexes left elbow. No surrounding erythema, no exudate or other drainage. Patient with mild to moderate proximal wound swelling and mild to moderate tenderness at same location, no bony tenderness.  Full range of motion present, however pain when flexing left elbow past 90. Left arm otherwise nontender. Bilateral hand grips strong and equal. Bilateral distal radial pulses equal and easily palpated. Psychiatric: Mood and affect are normal. Speech and behavior are normal. Patient exhibits appropriate insight and judgment   ___________________________________________   LABS (all labs ordered are listed, but only abnormal results are displayed)  Labs Reviewed - No data to display   PROCEDURES Procedures   Sling applied to left arm, neurovascular intact post application.  ____________________________________________   INITIAL IMPRESSION / ASSESSMENT AND PLAN / ED COURSE  Pertinent labs & imaging results that were available during my care of the patient were reviewed by me and considered in my medical decision making (see chart for details).  Well-appearing patient. No acute distress. Presents for the reevaluation of left posterior elbow wound. Wound appears to be healing well without infection. Wound appears to have a hematoma present. However with patient's job description as she reports she  is constantly moving her forearms and flexing her elbow at work. At this time would recommend patient that she can return back to work however without flexion or extension of her left elbow when working. Recommend for this restriction to remain in place until follow-up with [place] FNP  this week. Information given. Dressing applied. Counseled regarding cleaning. Also counseled regarding shoulder, elbow and wrist range of motion. Will apply sling to help support. Encourage keeping clean. Follow-up as directed. Work note stating these restrictions given. Tetanus immunization up to date.   Discussed follow up with Primary care physician this week. Discussed follow up and return parameters including no resolution or any worsening concerns. Patient verbalized understanding and agreed to plan.    ____________________________________________   FINAL CLINICAL IMPRESSION(S) / ED DIAGNOSES  Final diagnoses:  Elbow laceration, left, subsequent encounter  Traumatic hematoma of left elbow, initial encounter     Discharge Medication List as of 08/11/2016  7:40 PM      Note: This dictation was prepared with Dragon dictation along with smaller phrase technology. Any transcriptional errors that result from this process are unintentional.    Clinical Course      Marylene Land, NP 08/16/16 1126

## 2016-08-11 NOTE — ED Provider Notes (Signed)
[facility] Emergency Department Provider Note  ____________________________________________  Time seen: Approximately 2:06 AM  I have reviewed the triage vital signs and the nursing notes.   HISTORY  Chief Complaint Laceration    HPI Lisa Avila is a 60 y.o. female who presents emergency department complaining of laceration to the posterior left elbow. Patient states that she was at work when she accidentally turned and cut herself on a exposed metalpanel. Patient reports that she initially tried treatment with Band-Aid but the bleeding continued. Patient denies any loss of range of motion to elbow. She denies any numbness or tingling distally. Patient denies being on any blood thinners. She was able control the bleeding with direct pressure. No other injury at this time. This is a work comp event. Patient is unsure of her last tetanus shot.   Past Medical History:  Diagnosis Date  . Anxiety   . Asthma   . COPD (chronic obstructive pulmonary disease) ([place])   . Emphysema lung ([place])   . Fibroids   . History of colonic polyps   . Reflux   . Restless leg syndrome     Patient Active Problem List   Diagnosis Date Noted  . COPD GOLD C 02/26/2014  . Cough 02/26/2014  . Tobacco abuse 02/26/2014    Past Surgical History:  Procedure Laterality Date  . CHOLECYSTECTOMY  2010  . TUBAL LIGATION  1983    Prior to Admission medications   Medication Sig Start Date End Date Taking? Authorizing Provider  albuterol (PROVENTIL HFA;VENTOLIN HFA) 108 (90 BASE) MCG/ACT inhaler Inhale into the lungs every 6 (six) hours as needed for wheezing or shortness of breath.    Historical Provider, MD  fexofenadine (ALLEGRA) 180 MG tablet Take 180 mg by mouth daily.    Historical Provider, MD  HYDROcodone-homatropine (HYDROMET) 5-1.5 MG/5ML syrup Take 5 mLs by mouth every 6 (six) hours as needed for cough. Patient not taking: Reported on 01/31/2015 04/17/14   Tammy S Parrett,  NP  mometasone-formoterol (DULERA) 100-5 MCG/ACT AERO Inhale 2 puffs into the lungs 2 (two) times daily. 10/29/15   Juanito Doom, MD  Spacer/Aero-Holding Chambers (AEROCHAMBER MV) inhaler Use as instructed to aid with inhaled medications. 02/28/14   Juanito Doom, MD  tiotropium (SPIRIVA) 18 MCG inhalation capsule Place 1 capsule (18 mcg total) into inhaler and inhale daily. 01/09/15   Juanito Doom, MD  traMADol (ULTRAM) 50 MG tablet Take 1 tablet (50 mg total) by mouth every 6 (six) hours as needed. 01/31/15   Juanito Doom, MD  venlafaxine (EFFEXOR) 75 MG tablet Take 75 mg by mouth daily.    Historical Provider, MD    Allergies Review of patient's allergies indicates no known allergies.  Family History  Problem Relation Age of Onset  . Emphysema Maternal Grandmother   . Lung cancer Paternal Grandfather   . Asthma Paternal Grandmother     Social History Social History  Substance Use Topics  . Smoking status: Current Some Day Smoker    Packs/day: 1.50    Years: 44.00    Types: Cigarettes  . Smokeless tobacco: Never Used     Comment: only smokes .5ppd on days she works  . Alcohol use No     Review of Systems  Constitutional: No fever/chills Cardiovascular: no chest pain. Respiratory: no cough. No SOB. Musculoskeletal: Negative for musculoskeletal pain. Skin: Positive for laceration to the posterior left elbow Neurological: Negative for headaches, focal weakness or numbness. 10-point ROS otherwise  negative.  ____________________________________________   PHYSICAL EXAM:  VITAL SIGNS: ED Triage Vitals  Enc Vitals Group     BP 08/11/16 0039 137/79     Pulse Rate 08/11/16 0039 82     Resp 08/11/16 0039 12     Temp 08/11/16 0039 98 F (36.7 C)     Temp Source 08/11/16 0039 Oral     SpO2 08/11/16 0039 99 %     Weight 08/11/16 0040 120 lb (54.4 kg)     Height 08/11/16 0040 5\' 2"  (1.575 m)     Head Circumference --      Peak Flow --      Pain Score  08/11/16 0053 2     Pain Loc --      Pain Edu? --      Excl. in [place]? --      Constitutional: Alert and oriented. Well appearing and in no acute distress. Eyes: Conjunctivae are normal. PERRL. EOMI. Head: Atraumatic. Cardiovascular: Normal rate, regular rhythm. Normal S1 and S2.  Good peripheral circulation. Respiratory: Normal respiratory effort without tachypnea or retractions. Lungs CTAB. Good air entry to the bases with no decreased or absent breath sounds. Musculoskeletal: Full range of motion to all extremities. No gross deformities appreciated. Neurologic:  Normal speech and language. No gross focal neurologic deficits are appreciated.  Skin:  Skin is warm, dry and intact. No rash noted. 5 cm laceration is noted to the posterior left elbow. No bleeding at this time. No visible foreign body. Full range of motion of the underlying joint. Area is mildly tender to palpation over the laceration but otherwise is nontender to palpation over the joint. Examination of the joints proximal and distally revealed no acute abnormality. Radial pulse intact distally. Sensation intact distally. Psychiatric: Mood and affect are normal. Speech and behavior are normal. Patient exhibits appropriate insight and judgement.   ____________________________________________   LABS (all labs ordered are listed, but only abnormal results are displayed)  Labs Reviewed - No data to display ____________________________________________  EKG   ____________________________________________  RADIOLOGY   No results found.  ____________________________________________    PROCEDURES  Procedure(s) performed:    Marland KitchenMarland KitchenLaceration Repair Date/Time: 08/11/2016 5:38 AM Performed by: Betha Loa D Authorized by: Betha Loa D   Consent:    Consent obtained:  Verbal   Consent given by:  Patient   Risks discussed:  Pain Anesthesia (see MAR for exact dosages):    Anesthesia method:  Local  infiltration   Local anesthetic:  Lidocaine 1% WITH epi Laceration details:    Location:  Shoulder/arm   Shoulder/arm location:  L elbow   Length (cm):  5 Repair type:    Repair type:  Simple Pre-procedure details:    Preparation:  Patient was prepped and draped in usual sterile fashion Exploration:    Hemostasis achieved with:  Direct pressure   Wound exploration: wound explored through full range of motion and entire depth of wound probed and visualized     Contaminated: no   Treatment:    Area cleansed with:  Betadine   Amount of cleaning:  Standard   Irrigation solution:  Sterile saline   Irrigation method:  Syringe Skin repair:    Repair method:  Sutures   Suture size:  4-0   Suture material:  Nylon   Suture technique:  Simple interrupted   Number of sutures:  7 Approximation:    Approximation:  Close   V[place] border: well-aligned   Post-procedure details:    Dressing:  Non-adherent dressing   Patient tolerance of procedure:  Tolerated well, no immediate complications      Medications - No data to display   ____________________________________________   INITIAL IMPRESSION / ASSESSMENT AND PLAN / ED COURSE  Pertinent labs & imaging results that were available during my care of the patient were reviewed by me and considered in my medical decision making (see chart for details).  Clinical Course    Patient's diagnosis is consistent with A laceration to the left elbow. This is cholecystitis described above. Patient's tetanus shot was updated tonight. Patient is given wound care shortness. Patient may take Tylenol and Motrin as needed for symptomatic relief. Patient will follow-up with primary care or urgent care in one week for suture removal..  Patient is given ED precautions to return to the ED for any worsening or new symptoms.     ____________________________________________  FINAL CLINICAL IMPRESSION(S) / ED DIAGNOSES  Final diagnoses:  None       NEW MEDICATIONS STARTED DURING THIS VISIT:  New Prescriptions   No medications on file        This chart was dictated using voice recognition software/Dragon. Despite best efforts to proofread, errors can occur which can change the meaning. Any change was purely unintentional.    Darletta Moll, PA-C 08/11/16 Sparks [place], MD 08/11/16 [id number]

## 2016-08-11 NOTE — ED Triage Notes (Signed)
Pt reports was injured at work at [employer] around 23:00 last night. States cut LEFT elbow on metal panel. Pressure bandage applied to elbow.

## 2016-08-11 NOTE — ED Notes (Signed)
WC completed and delivered to lab for courier p/u. 

## 2016-08-11 NOTE — Discharge Instructions (Signed)
Use sling as discussed. Range of motion exercises multiple times per day. Keep clean as directed. Follow up with [facility] this week. Call tomorrow.   Follow up with your primary care physician this week as needed. Return to Urgent care for new or worsening concerns.

## 2016-08-11 NOTE — ED Notes (Addendum)
[facility], [phone] Marianna Fuss, to determine if breath analysis required, no answer. UDS will be completed per profile sheet.

## 2016-08-11 NOTE — ED Triage Notes (Addendum)
Patient cut her left elbow yesterday at work and was seen at [facility] ED last night. They closed the laceration with 7 stitches. Employer wanted her to come to Urgent Care to see if we agreed with the restrictions on her work note.

## 2016-09-02 ENCOUNTER — Telehealth: Payer: Self-pay | Admitting: *Deleted

## 2016-09-02 NOTE — Telephone Encounter (Signed)
Left voicemail for patient notifyng them that it is time to schedule annual low dose lung cancer screening CT scan. Instructed patient to call back to verify information prior to the scan being scheduled.  

## 2016-12-01 ENCOUNTER — Encounter: Payer: Self-pay | Admitting: *Deleted

## 2017-01-06 ENCOUNTER — Telehealth: Payer: Self-pay | Admitting: *Deleted

## 2017-01-06 DIAGNOSIS — Z87891 Personal history of nicotine dependence: Secondary | ICD-10-CM

## 2017-01-06 NOTE — Telephone Encounter (Signed)
Notified patient that annual lung cancer screening low dose CT scan is due. Confirmed that patient is within the age range of 55-77, and asymptomatic, (no signs or symptoms of lung cancer). Patient denies illness that would prevent curative treatment for lung cancer if found. The patient is a current smoker, with a 62.5 pack year history. The shared decision making visit was done 03/08/15. Patient is agreeable for CT scan being scheduled.

## 2017-01-15 ENCOUNTER — Ambulatory Visit: Admission: RE | Admit: 2017-01-15 | Payer: BLUE CROSS/BLUE SHIELD | Source: Ambulatory Visit

## 2017-01-22 ENCOUNTER — Ambulatory Visit
Admission: RE | Admit: 2017-01-22 | Discharge: 2017-01-22 | Disposition: A | Discharge status: Home / Self Care | Payer: 59 | Source: Ambulatory Visit | Attending: Oncology | Admitting: Oncology

## 2017-01-22 DIAGNOSIS — Z87891 Personal history of nicotine dependence: Secondary | ICD-10-CM

## 2017-01-22 DIAGNOSIS — I700 Atherosclerosis of aorta: Secondary | ICD-10-CM | POA: Insufficient documentation

## 2017-01-22 DIAGNOSIS — I2510 Atherosclerotic heart disease of native coronary artery without angina pectoris: Secondary | ICD-10-CM | POA: Diagnosis not present

## 2017-01-25 ENCOUNTER — Telehealth: Payer: Self-pay | Admitting: *Deleted

## 2017-01-25 NOTE — Telephone Encounter (Signed)
Notified patient of LDCT lung cancer screening results with recommendation for 12 month follow up imaging. Also notified of incidental finding noted below and encouraged to discuss with PCP for evaluation of need of further diagnostic evaluation or medical management changes. Patient verbalizes understanding. This note will be forwarded to PCP.  IMPRESSION: 1. Lung-RADS Category 2, benign appearance or behavior. Continue annual screening with low-dose chest CT without contrast in 12 months. 2. Aortic atherosclerosis (ICD10-170.0). Coronary artery calcification.

## 2018-01-19 ENCOUNTER — Telehealth: Payer: Self-pay | Admitting: *Deleted

## 2018-01-19 NOTE — Telephone Encounter (Signed)
Attempted to leave message for patient to notify them that it is time to schedule annual low dose lung cancer screening CT scan. However, this option is not available. Will attempt at later date.  

## 2018-01-28 ENCOUNTER — Telehealth: Payer: Self-pay | Admitting: *Deleted

## 2018-01-28 NOTE — Telephone Encounter (Signed)
Attempted to leave message for patient to notify them that it is time to schedule annual low dose lung cancer screening CT scan. However this option is not available. Will attempt to contact at later date.

## 2018-02-07 NOTE — Progress Notes (Signed)
* [facility]     Assessment and Plan:  COPD/emphysema with dyspnea on exertion, progressive. - The patient is on maximal therapy, she has had no recent exacerbations.  She is very active with outdoor yard work and gardening, however she notes that her dyspnea has been progressive over the last year and has had more difficulty in doing her  activities. -Patient is currently on maximal therapy with Spiriva and Symbicort, albuterol metered-dose inhaler 3 times daily, she is recommended to continue, in addition we discussed smoking cessation strategies as below.   Nicotine abuse. -Discussed that she appears to have reached a tipping point with her breathing and smoking.  She does a lot of gardening and outdoor activities, we have discussed that she has reached a point where she will need to put down either the cigarettes, or the rake, as she may not be able to do much both for much longer -Patient has quit in the past for up to 1 week with Chantix however when she increased to the high dose of Chantix she developed dose-limiting nausea and had to stop.  She does notice that even the low-dose was helpful in reducing nicotine cravings. -I discussed with her that we will start Chantix in combination with a quit date, the low-dose of 0.5 mg twice daily.  She can increase to 2 tablets as tolerated by nausea symptoms or stay at the lower dose. -Greater than 10 minutes spent in discussion.  -CT lung cancer screening images completed in 2016 and January 2018, on last scan it was recommended a repeat scan which should be done in 1 year, will order at this time.   Date: 02/08/2018  MRN# [id number] Lisa Avila [birth year]   Lisa Avila is a 62 y.o. old female seen in follow up for chief complaint of  Chief Complaint  Patient presents with  . Advice Only    seen [facility] last in 2016 for COPD: SOB; cough, prod in mornings:      HPI:  The patient is a 62 year old female, she was  last seen at [facility] pulmonary by Dr. Curt Jews approximately 2 years ago.  At that time it was recommended that she stop smoking, she was continued on her pulmonary medications. She feels that her breathing has declined, she is very active outside, and when carrying heavy items will have to rest. She uses a push mower in the spring.  She is on spiriva once daily, symbicort 2 puffs bid, albuterol MDI 2 puffs tid, which is different than 6 months ago when she used it only once every other day.  She is smoking 1.5 ppd, she has quit for 2 days, she enjoys it and does not think that she is ready to quit.   Imaging personally reviewed, low-dose CT chest 01/22/17, mild emphysematous changes, a few scattered tiny nodules, follow-up CT was recommended in 1 year.   Medication:    Current Outpatient Medications:  .  albuterol (PROVENTIL HFA;VENTOLIN HFA) 108 (90 BASE) MCG/ACT inhaler, Inhale into the lungs every 6 (six) hours as needed for wheezing or shortness of breath., Disp: , Rfl:  .  fexofenadine (ALLEGRA) 180 MG tablet, Take 180 mg by mouth daily., Disp: , Rfl:  .  Spacer/Aero-Holding Chambers (AEROCHAMBER MV) inhaler, Use as instructed to aid with inhaled medications., Disp: 1 each, Rfl: 0 .  tiotropium (SPIRIVA) 18 MCG inhalation capsule, Place 1 capsule (18 mcg total) into inhaler and inhale daily., Disp: 10 capsule, Rfl: 0 .  traMADol (ULTRAM) 50 MG tablet, Take 1 tablet (50 mg total) by mouth every 6 (six) hours as needed., Disp: 20 tablet, Rfl: 0 .  venlafaxine (EFFEXOR) 75 MG tablet, Take 75 mg by mouth daily., Disp: , Rfl:    Allergies:  Patient has no known allergies.  Review of Systems: Gen:  Denies  fever, sweats. HEENT: Denies blurred vision. Cvc:  No dizziness, chest pain or heaviness Resp:   Denies cough or sputum porduction. Gi: Denies swallowing difficulty, stomach pain. constipation, bowel incontinence Gu:  Denies bladder incontinence, burning urine Ext:   No Joint pain,  stiffness. Skin: No skin rash, easy bruising. Endoc:  No polyuria, polydipsia. Psych: No depression, insomnia. Other:  All other systems were reviewed and found to be negative other than what is mentioned in the HPI.   Physical Examination:   VS: BP 130/80 (BP Location: Left Arm, Cuff Size: Normal)   Pulse 94   Ht 5\' 2"  (1.575 m)   Wt 136 lb (61.7 kg)   SpO2 99%   BMI 24.87 kg/m    General Appearance: No distress  Neuro:without focal findings,  speech normal,  HEENT: PERRLA, EOM intact. Pulmonary: normal breath sounds, No wheezing.   CardiovascularNormal S1,S2.  No m/r/g.   Abdomen: Benign, Soft, non-tender. Renal:  No costovertebral tenderness  GU:  Not performed at this time. Endoc: No evident thyromegaly, no signs of acromegaly. Skin:   warm, no rash. Extremities: normal, no cyanosis, clubbing.   LABORATORY PANEL:   CBC No results for input(s): WBC, HGB, HCT, PLT in the last 168 hours. ------------------------------------------------------------------------------------------------------------------  Chemistries  No results for input(s): NA, K, CL, CO2, GLUCOSE, BUN, CREATININE, CALCIUM, MG, AST, ALT, ALKPHOS, BILITOT in the last 168 hours.  Invalid input(s): GFRCGP ------------------------------------------------------------------------------------------------------------------  Cardiac Enzymes No results for input(s): TROPONINI in the last 168 hours. ------------------------------------------------------------  RADIOLOGY:   No results found for this or any previous visit. No results found for this or any previous visit. ------------------------------------------------------------------------------------------------------------------  Thank  you for allowing [facility] to assist in the care of your patient. Our recommendations are noted above.  Please contact us if we can be of further service.   Marda Stalker, MD.  [facility] Office Number: [phone]  Patricia Pesa, M.D.  Merton Border, M.D  02/08/2018

## 2018-02-08 ENCOUNTER — Ambulatory Visit (INDEPENDENT_AMBULATORY_CARE_PROVIDER_SITE_OTHER): Payer: 59 | Admitting: Internal Medicine

## 2018-02-08 ENCOUNTER — Encounter: Payer: Self-pay | Admitting: Internal Medicine

## 2018-02-08 ENCOUNTER — Telehealth: Payer: Self-pay | Admitting: *Deleted

## 2018-02-08 VITALS — BP 130/80 | HR 94 | Ht 62.0 in | Wt 136.0 lb

## 2018-02-08 DIAGNOSIS — Z122 Encounter for screening for malignant neoplasm of respiratory organs: Secondary | ICD-10-CM

## 2018-02-08 DIAGNOSIS — J449 Chronic obstructive pulmonary disease, unspecified: Secondary | ICD-10-CM

## 2018-02-08 DIAGNOSIS — F17210 Nicotine dependence, cigarettes, uncomplicated: Secondary | ICD-10-CM

## 2018-02-08 DIAGNOSIS — R911 Solitary pulmonary nodule: Secondary | ICD-10-CM | POA: Diagnosis not present

## 2018-02-08 DIAGNOSIS — Z720 Tobacco use: Secondary | ICD-10-CM

## 2018-02-08 MED ORDER — VARENICLINE TARTRATE 0.5 MG X 11 & 1 MG X 42 PO MISC: ORAL | 0 refills | Status: DC

## 2018-02-08 MED ORDER — VARENICLINE TARTRATE 0.5 MG PO TABS: 0.5000 mg | ORAL_TABLET | Freq: Two times a day (BID) | ORAL | 3 refills | Status: DC

## 2018-02-08 NOTE — Addendum Note (Signed)
Addended by: Stephanie Coup on: 02/08/2018 02:13 PM   Modules accepted: Orders

## 2018-02-08 NOTE — Addendum Note (Signed)
Addended by: Garnette Gunner K on: 02/08/2018 12:00 PM   Modules accepted: Orders

## 2018-02-08 NOTE — Telephone Encounter (Signed)
New rx for Chantix started pack printed and will be faxed once md signs. Original rx was for 0.5 mg and patient will need PA but needs starter pack 1st.

## 2018-02-08 NOTE — Patient Instructions (Addendum)
Will start chantix 0.5 mg (1 tablet) twice daily. May increase to 2 tabs in the morning if nausea is tolerable.   Will send for low dose CT scan.   --The best way to quit is to set a quit date, usually a day that has meaning like someone's birthday.  --Start any medication prescribed for quitting one week before you quit date. Then toss out the cigarettes on your quit date.  --If you start smoking again, start from scratch--set another quit day and try again!

## 2018-02-14 ENCOUNTER — Telehealth: Payer: Self-pay | Admitting: *Deleted

## 2018-02-14 NOTE — Telephone Encounter (Signed)
PA initiated for Chantix pending decision.

## 2018-02-16 ENCOUNTER — Other Ambulatory Visit: Payer: Self-pay | Admitting: Internal Medicine

## 2018-02-16 MED ORDER — BUPROPION HCL ER (SR) 150 MG PO TB12: 150.0000 mg | ORAL_TABLET | Freq: Two times a day (BID) | ORAL | 1 refills | Status: DC

## 2018-02-16 NOTE — Telephone Encounter (Signed)
Left a vmail on patient # letting her know medication has been changed. If she has any questions she may call office back. Nothing further needed at this time.

## 2018-02-16 NOTE — Telephone Encounter (Signed)
PA for Chantix has been denied: Pt must try and fail: Generic Zyban (bupropion). Please advise.

## 2018-02-16 NOTE — Telephone Encounter (Signed)
Ordered

## 2018-02-18 ENCOUNTER — Telehealth: Payer: Self-pay | Admitting: *Deleted

## 2018-02-18 ENCOUNTER — Ambulatory Visit
Admission: RE | Admit: 2018-02-18 | Discharge: 2018-02-18 | Disposition: A | Discharge status: Home / Self Care | Payer: 59 | Source: Ambulatory Visit | Attending: Internal Medicine | Admitting: Internal Medicine

## 2018-02-18 DIAGNOSIS — J439 Emphysema, unspecified: Secondary | ICD-10-CM | POA: Diagnosis not present

## 2018-02-18 DIAGNOSIS — R911 Solitary pulmonary nodule: Secondary | ICD-10-CM

## 2018-02-18 DIAGNOSIS — I700 Atherosclerosis of aorta: Secondary | ICD-10-CM | POA: Insufficient documentation

## 2018-02-18 NOTE — Telephone Encounter (Signed)
Patient is scheduled for annual lung screening scan today. Confirmed that patient is within the age range of 55-77, and asymptomatic, (no signs or symptoms of lung cancer). Patient denies illness that would prevent curative treatment for lung cancer if found. Verified smoking history, (current, 64 pack year). The shared decision making visit was done 03/08/15. Marland Kitchen

## 2018-02-24 ENCOUNTER — Telehealth: Payer: Self-pay | Admitting: Internal Medicine

## 2018-02-24 NOTE — Telephone Encounter (Signed)
Pt informed of CT results. Nothing further needed.

## 2018-02-24 NOTE — Telephone Encounter (Signed)
CT is unchanged, recommended repeat CT in 1 year. You can also give her a copy of her results.

## 2018-02-24 NOTE — Telephone Encounter (Signed)
Pt would like her CT results. Please call.

## 2018-02-24 NOTE — Telephone Encounter (Signed)
Pt would like results of CT scan

## 2018-04-18 LAB — RESULTS CONSOLE HPV: CHL HPV: NEGATIVE

## 2018-04-18 LAB — HM PAP SMEAR: HM Pap smear: NORMAL

## 2018-08-08 ENCOUNTER — Encounter: Payer: Self-pay | Admitting: Emergency Medicine

## 2018-08-08 ENCOUNTER — Ambulatory Visit
Admission: EM | Admit: 2018-08-08 | Discharge: 2018-08-08 | Disposition: A | Discharge status: Home / Self Care | Payer: 59 | Attending: Family Medicine | Admitting: Family Medicine

## 2018-08-08 ENCOUNTER — Other Ambulatory Visit: Payer: Self-pay

## 2018-08-08 DIAGNOSIS — R197 Diarrhea, unspecified: Secondary | ICD-10-CM

## 2018-08-08 MED ORDER — ONDANSETRON 8 MG PO TBDP: 8.0000 mg | ORAL_TABLET | Freq: Two times a day (BID) | ORAL | 0 refills | Status: DC

## 2018-08-08 NOTE — ED Triage Notes (Signed)
Patient c/o diarrhea that started 2 weeks ago. She reports she is having around 5 watery stools per day. Denies nausea and vomiting.

## 2018-08-08 NOTE — ED Provider Notes (Signed)
[facility]    CSN: [id number] Arrival date & time: 08/08/18  1142     History   Chief Complaint Chief Complaint  Patient presents with  . Diarrhea    HPI VETA DAMBROSIA is a 62 y.o. female.   HPI  62 year old female presents with diarrhea that started about 2 weeks ago.  Seen by her primary care physician who diagnosed her with a viral gastroenteritis.  She has had nausea and has been on promethazine for that.States that the stools are watery, have some mucus but no blood.  Does eat outside of the home frequently because she lives by herself. C/O Some mild lower abdominal cramping at the time of the diarrhea.  States that she has been able to tolerate foods and liquids without difficulty.  Did take Imodium for 2 days but did not seem to alter the course of the diarrhea and has since discontinued it.  She said no fever or chills.  Recently her BM has mostly confined to the early morning and towards the evening does not have the diarrhea.  Typically she will not have bowel movements for up to 2 weeks at a time.          Past Medical History:  Diagnosis Date  . Anxiety   . Asthma   . COPD (chronic obstructive pulmonary disease) ([place])   . Emphysema lung ([place])   . Fibroids   . History of colonic polyps   . Reflux   . Restless leg syndrome     Patient Active Problem List   Diagnosis Date Noted  . COPD GOLD C 02/26/2014  . Cough 02/26/2014  . Tobacco abuse 02/26/2014    Past Surgical History:  Procedure Laterality Date  . CHOLECYSTECTOMY  2010  . TUBAL LIGATION  1983    OB History   None      Home Medications    Prior to Admission medications   Medication Sig Start Date End Date Taking? Authorizing Provider  albuterol (PROVENTIL HFA;VENTOLIN HFA) 108 (90 BASE) MCG/ACT inhaler Inhale into the lungs every 6 (six) hours as needed for wheezing or shortness of breath.   Yes [provider]  tiotropium (SPIRIVA) 18 MCG inhalation capsule  Place 1 capsule (18 mcg total) into inhaler and inhale daily. 01/09/15  Yes Juanito Doom, MD  traMADol (ULTRAM) 50 MG tablet Take 1 tablet (50 mg total) by mouth every 6 (six) hours as needed. 01/31/15  Yes Juanito Doom, MD  ondansetron (ZOFRAN ODT) 8 MG disintegrating tablet Take 1 tablet (8 mg total) by mouth 2 (two) times daily. 08/08/18   Lorin Picket, PA-C  Spacer/Aero-Holding Chambers (AEROCHAMBER MV) inhaler Use as instructed to aid with inhaled medications. 02/28/14   Juanito Doom, MD  varenicline (CHANTIX) 0.5 MG tablet Take 1 tablet (0.5 mg total) by mouth 2 (two) times daily. May increase to 2 tabs in the morning if tolerated. 02/08/18   Laverle Hobby, MD    Family History Family History  Problem Relation Age of Onset  . Emphysema Maternal Grandmother   . Lung cancer Paternal Grandfather   . Asthma Paternal Grandmother   . Hypertension Mother   . Hypertension Father     Social History Social History   Tobacco Use  . Smoking status: Current Some Day Smoker    Packs/day: 1.50    Years: 44.00    Pack years: 66.00    Types: Cigarettes  . Smokeless tobacco: Never Used  . Tobacco  comment: only smokes .5ppd on days she works  Substance Use Topics  . Alcohol use: No    Alcohol/week: 0.0 standard drinks  . Drug use: No     Allergies   Patient has no known allergies.   Review of Systems Review of Systems  Constitutional: Positive for activity change, appetite change and fatigue. Negative for chills and fever.  Gastrointestinal: Positive for abdominal pain, diarrhea and nausea. Negative for abdominal distention, anal bleeding, blood in stool, constipation, rectal pain and vomiting.  All other systems reviewed and are negative.    Physical Exam Triage Vital Signs ED Triage Vitals  Enc Vitals Group     BP 08/08/18 1157 95/63     Pulse Rate 08/08/18 1157 87     Resp 08/08/18 1157 16     Temp 08/08/18 1157 98.3 F (36.8 C)     Temp Source  08/08/18 1157 Oral     SpO2 08/08/18 1157 98 %     Weight 08/08/18 1154 127 lb (57.6 kg)     Height 08/08/18 1154 '5\' 2"'  (1.575 m)     Head Circumference --      Peak Flow --      Pain Score 08/08/18 1154 0     Pain Loc --      Pain Edu? --      Excl. in [place]? --    No data found.  Updated Vital Signs BP 95/63 (BP Location: Right Arm)   Pulse 87   Temp 98.3 F (36.8 C) (Oral)   Resp 16   Ht '5\' 2"'  (1.575 m)   Wt 127 lb (57.6 kg)   SpO2 98%   BMI 23.23 kg/m   Visual Acuity Right Eye Distance:   Left Eye Distance:   Bilateral Distance:    Right Eye Near:   Left Eye Near:    Bilateral Near:     Physical Exam  Constitutional: She is oriented to person, place, and time. She appears well-developed and well-nourished. No distress.  HENT:  Head: Normocephalic.  Eyes: Pupils are equal, round, and reactive to light.  Neck: Normal range of motion.  Pulmonary/Chest: Effort normal and breath sounds normal.  Abdominal: Soft. Bowel sounds are normal. She exhibits no distension and no mass. There is tenderness. There is no rebound and no guarding.  Exam performed in presence of Ria Comment, Lexicographer.  Patient has normal bowel sounds.  No distention is seen.  The abdomen is soft.  She has mild reproducible tenderness in the left lower quadrant but otherwise has no tenderness throughout the remainder of the abdomen.  She has no guarding no rebound.  Musculoskeletal: Normal range of motion.  Neurological: She is alert and oriented to person, place, and time.  Skin: Skin is warm and dry. She is not diaphoretic.  Psychiatric: She has a normal mood and affect. Her behavior is normal. Judgment and thought content normal.  Nursing note and vitals reviewed.    UC Treatments / Results  Labs (all labs ordered are listed, but only abnormal results are displayed) Labs Reviewed  GASTROINTESTINAL PANEL BY PCR, STOOL (REPLACES STOOL CULTURE)    EKG None  Radiology No results  found.  Procedures Procedures (including critical care time)  Medications Ordered in UC Medications - No data to display  Initial Impression / Assessment and Plan / UC Course  I have reviewed the triage vital signs and the nursing notes.  Pertinent labs & imaging results that were available during  my care of the patient were reviewed by me and considered in my medical decision making (see chart for details).     Plan: 1. Test/x-ray results and diagnosis reviewed with patient 2. rx as per orders; risks, benefits, potential side effects reviewed with patient 3. Recommend supportive treatment with continued forcing fluids.  This patient has had the symptoms for over 2 weeks I recommended GI panel by PCR.  She will take the kit home and return it with the sample as soon as possible.  In the meantime I will not give her medications to control the diarrhea.  If PCR is negative then the patient will need to follow-up with the gastroenterologist.  She was given the name of [facility].  Given her a prescription for Zofran to replace the promethazine which has caused her excessive sleepiness. 4. F/u prn if symptoms worsen or don't improve  Final Clinical Impressions(s) / UC Diagnoses   Final diagnoses:  Diarrhea of presumed infectious origin   Discharge Instructions   None    ED Prescriptions    Medication Sig Dispense Auth. Provider   ondansetron (ZOFRAN ODT) 8 MG disintegrating tablet Take 1 tablet (8 mg total) by mouth 2 (two) times daily. 6 tablet Lorin Picket, PA-C     Controlled Substance Prescriptions [place] Controlled Substance Registry consulted? Not Applicable   Lorin Picket, PA-C 08/08/18 1423

## 2018-08-09 ENCOUNTER — Other Ambulatory Visit
Admission: RE | Admit: 2018-08-09 | Discharge: 2018-08-09 | Disposition: A | Discharge status: Home / Self Care | Payer: 59 | Source: Ambulatory Visit | Attending: Emergency Medicine | Admitting: Emergency Medicine

## 2018-08-09 ENCOUNTER — Telehealth: Payer: Self-pay | Admitting: Emergency Medicine

## 2018-08-09 DIAGNOSIS — R197 Diarrhea, unspecified: Secondary | ICD-10-CM | POA: Insufficient documentation

## 2018-08-09 LAB — GASTROINTESTINAL PANEL BY PCR, STOOL (REPLACES STOOL CULTURE)

## 2018-08-09 MED ORDER — AZITHROMYCIN 500 MG PO TABS: 500.0000 mg | ORAL_TABLET | Freq: Every day | ORAL | 0 refills | Status: AC

## 2018-08-09 NOTE — Telephone Encounter (Signed)
GI panel positive for campylobacter. Please call and inform patient. If patient remains symptomatic, please call and patient oral a azithromycin 500 mg tablet to take for 3 days, quantity 3, no refills.  Also please continue to encourage patient to follow-up as directed with primary or GI.

## 2018-08-09 NOTE — Telephone Encounter (Signed)
Patient was notified of her GI panel result. Patient states that she still is having diarrhea and cramping.  Patient was informed that Marylene Land, NP recommended Azithromycin 500mg  1 tablet for 3 days with no refills if her symptoms are not improving.  This prescription was sent to the [facility] in [place].  Patient instructed to take the antibiotic as directed and that if her symptoms persist or worsen to follow-up with her PCP or GI.  Patient verbalized understanding.

## 2018-08-09 NOTE — Telephone Encounter (Addendum)
Lab called to give result on positive Campylobacter Infection in patient's stool.  Dr. Zenda Alpers and Marylene Land, NP notified of this result.  Left message for patient to give [place] a call back regarding her lab result.  The Communicable Disease form was faxed to [facility].

## 2018-10-02 NOTE — Progress Notes (Signed)
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 - The patient is on maximal therapy, she has had no recent exacerbations.  She is very active with outdoor yard work and gardening, however she notes that her dyspnea has been progressive over the last year and has had more difficulty in doing her  Activities. -Patient is currently on maximal therapy with Spiriva, Symbicort, albuterol metered-dose inhaler.  We discussed that the most important things she can do for her respiratory health at this time is to continue activity, quit smoking. -PFT before next follow-up. -CT lung cancer screening images completed in 2016 and January 2018, February 2019.  On last scan it was recommended a repeat scan which should be done in 1 year, will order at next visit.  Nicotine abuse. -Discussed the importance of smoke cessation, spent for medicine discussion. - Had previously ordered Chantix, however was not covered she was required to try and fail bupropion.  She has failed bupropion in the past, did develop significant side effects of depression, therefore she should be able to qualify for Chantix. - She is not quite yet ready to quit however, when she is ready she is asked to call us back and we will be happy to order Chantix for her.  Orders Placed This Encounter  Procedures  . Pulmonary Function Test [facility] Only   Return in about 6 months (around 04/04/2019).   Date: 10/02/2018  MRN# [id number] Lisa Avila 03-Sep-1956   Lisa Avila is a 62 y.o. old female seen in follow up for chief complaint of  Chief Complaint  Patient presents with  . COPD    sob with exertion, cough ( non productive)... am wheezing. Pt still a smoker.  . Shortness of Breath    pt insurance would no longer cover [facility], she is now on Symbicort 160.     HPI:  The patient is a 62 year old female smoker with COPD.  Last visit she was noted to have  progressive exertional dyspnea when working outside.  She has been maintained on Spiriva, Symbicort.  We discussed that she was already on maximal therapy, and I highly recommended smoke cessation. Since her last visit she had a flare up over the summer and given a course of prednisone. She remains on spiriva once daily, symbicort 2 puffs twice per daily, and rinses her mouth.  She feels that her breathing is about the same she still has dyspnea on moderate to heavy exertion in her yard.  She continues to smoke 1.5 ppd, insurance would not cover chantix, she instead did bupropion but had failed it in the past so was not willing to try it. She had significant depression and was tearful when taking it in the past.    Imaging personally reviewed, low-dose CT chest 02/18/2018 in comparison with previous on 01/22/17, mild emphysematous changes have persisted, and have perhaps advanced slightly, a few scattered tiny nodules, follow-up CT was again recommended in 1 year.   Medication:    Current Outpatient Medications:  .  albuterol (PROVENTIL HFA;VENTOLIN HFA) 108 (90 BASE) MCG/ACT inhaler, Inhale into the lungs every 6 (six) hours as needed for wheezing or shortness of breath., Disp: , Rfl:  .  ondansetron (ZOFRAN ODT) 8 MG disintegrating tablet, Take 1 tablet (8 mg total) by mouth 2 (two) times daily., Disp: 6 tablet, Rfl: 0 .  Spacer/Aero-Holding Chambers (AEROCHAMBER MV) inhaler, Use as instructed to aid with  inhaled medications., Disp: 1 each, Rfl: 0 .  tiotropium (SPIRIVA) 18 MCG inhalation capsule, Place 1 capsule (18 mcg total) into inhaler and inhale daily., Disp: 10 capsule, Rfl: 0 .  traMADol (ULTRAM) 50 MG tablet, Take 1 tablet (50 mg total) by mouth every 6 (six) hours as needed., Disp: 20 tablet, Rfl: 0 .  varenicline (CHANTIX) 0.5 MG tablet, Take 1 tablet (0.5 mg total) by mouth 2 (two) times daily. May increase to 2 tabs in the morning if tolerated., Disp: 45 tablet, Rfl: 3   Allergies:    Patient has no known allergies.  Review of Systems:  Constitutional: Feels well. Cardiovascular: Denies chest pain, exertional chest pain.  Pulmonary: Denies hemoptysis, pleuritic chest pain.   The remainder of systems were reviewed and were found to be negative other than what is documented in the HPI.    Physical Examination:   VS: BP 122/68 (BP Location: Left Arm, Cuff Size: Normal)   Pulse 91   Resp 16   Ht 5\' 2"  (1.575 m)   Wt 131 lb (59.4 kg)   SpO2 97%   BMI 23.96 kg/m   General Appearance: No distress  Neuro:without focal findings, mental status, speech normal, alert and oriented HEENT: PERRLA, EOM intact Pulmonary: No wheezing, No rales  CardiovascularNormal S1,S2.  No m/r/g.  Abdomen: Benign, Soft, non-tender, No masses Renal:  No costovertebral tenderness  GU:  No performed at this time. Endoc: No evident thyromegaly, no signs of acromegaly or Cushing features Skin:   warm, no rashes, no ecchymosis  Extremities: normal, no cyanosis, clubbing.     LABORATORY PANEL:   CBC No results for input(s): WBC, HGB, HCT, PLT in the last 168 hours. ------------------------------------------------------------------------------------------------------------------  Chemistries  No results for input(s): NA, K, CL, CO2, GLUCOSE, BUN, CREATININE, CALCIUM, MG, AST, ALT, ALKPHOS, BILITOT in the last 168 hours.  Invalid input(s): GFRCGP ------------------------------------------------------------------------------------------------------------------  Cardiac Enzymes No results for input(s): TROPONINI in the last 168 hours. ------------------------------------------------------------  RADIOLOGY:   No results found for this or any previous visit. No results found for this or any previous visit. ------------------------------------------------------------------------------------------------------------------  Thank  you for allowing [facility] to  assist in the care of your patient. Our recommendations are noted above.  Please contact us if we can be of further service.  Marda Stalker, M.D., F.C.C.P.  Board Certified in Internal Medicine, Pulmonary Medicine, [place], and Sleep Medicine.  [facility] Office Number: [phone]   10/02/2018

## 2018-10-03 ENCOUNTER — Ambulatory Visit (INDEPENDENT_AMBULATORY_CARE_PROVIDER_SITE_OTHER): Payer: 59 | Admitting: Internal Medicine

## 2018-10-03 ENCOUNTER — Encounter: Payer: Self-pay | Admitting: Internal Medicine

## 2018-10-03 VITALS — BP 122/68 | HR 91 | Resp 16 | Ht 62.0 in | Wt 131.0 lb

## 2018-10-03 DIAGNOSIS — J449 Chronic obstructive pulmonary disease, unspecified: Secondary | ICD-10-CM

## 2018-10-03 DIAGNOSIS — F17210 Nicotine dependence, cigarettes, uncomplicated: Secondary | ICD-10-CM | POA: Diagnosis not present

## 2018-10-03 DIAGNOSIS — Z23 Encounter for immunization: Secondary | ICD-10-CM | POA: Diagnosis not present

## 2018-10-03 DIAGNOSIS — Z720 Tobacco use: Secondary | ICD-10-CM

## 2018-10-03 NOTE — Patient Instructions (Addendum)
Continue symbicort, spiriva.  Will check PFT before next visit.  Flu shot today.   --Quitting smoking is the most important thing that you can do for your health.  --Quitting smoking will have greater affect on your health than any medicine that we can give you.

## 2018-12-30 ENCOUNTER — Other Ambulatory Visit: Payer: Self-pay | Admitting: Family Medicine

## 2018-12-30 ENCOUNTER — Ambulatory Visit
Admission: RE | Admit: 2018-12-30 | Discharge: 2018-12-30 | Disposition: A | Discharge status: Home / Self Care | Payer: 59 | Attending: Family Medicine | Admitting: Family Medicine

## 2018-12-30 ENCOUNTER — Ambulatory Visit
Admission: RE | Admit: 2018-12-30 | Discharge: 2018-12-30 | Disposition: A | Discharge status: Home / Self Care | Payer: 59 | Source: Ambulatory Visit | Attending: Family Medicine | Admitting: Family Medicine

## 2018-12-30 DIAGNOSIS — R05 Cough: Secondary | ICD-10-CM

## 2018-12-30 DIAGNOSIS — R059 Cough, unspecified: Secondary | ICD-10-CM

## 2019-01-20 ENCOUNTER — Encounter: Payer: Self-pay | Admitting: Internal Medicine

## 2019-01-20 ENCOUNTER — Ambulatory Visit (INDEPENDENT_AMBULATORY_CARE_PROVIDER_SITE_OTHER): Payer: 59 | Admitting: Internal Medicine

## 2019-01-20 VITALS — BP 120/80 | HR 85 | Ht 62.0 in | Wt 131.0 lb

## 2019-01-20 DIAGNOSIS — Z122 Encounter for screening for malignant neoplasm of respiratory organs: Secondary | ICD-10-CM | POA: Diagnosis not present

## 2019-01-20 DIAGNOSIS — Z720 Tobacco use: Secondary | ICD-10-CM

## 2019-01-20 DIAGNOSIS — F17210 Nicotine dependence, cigarettes, uncomplicated: Secondary | ICD-10-CM | POA: Diagnosis not present

## 2019-01-20 DIAGNOSIS — C760 Malignant neoplasm of head, face and neck: Secondary | ICD-10-CM | POA: Diagnosis not present

## 2019-01-20 DIAGNOSIS — J449 Chronic obstructive pulmonary disease, unspecified: Secondary | ICD-10-CM | POA: Diagnosis not present

## 2019-01-20 NOTE — Progress Notes (Signed)
* [facility]     Assessment and Plan:  COPD/emphysema group B with dyspnea on exertion. -Acute exacerbation of COPD, patient is already received appropriate courses of antibiotics, she is currently on prednisone 40 mg daily.  I suspect that she is having a prolonged recovery due to her severe underlying COPD/emphysema.  I have asked her to continue taking 40 mg once daily for the next week, she can then wean down by 10 mg daily until gone. -Patient is otherwise on maximal therapy for her COPD/emphysema, I have asked her to use the nebulizer more often, she has been hesitant to use this because she thought she could get "addicted".  I have let her know that she should use it as needed and it is not addictive. -Patient is currently on maximal therapy with Spiriva, Symbicort, albuterol metered-dose inhaler.  We discussed that the most important things she can do for her respiratory health at this time is to continue activity, quit smoking. -PFT before next follow-up. -CT lung cancer screening images completed in 2016 and January 2018, February 2019.  On last scan it was recommended a repeat scan in 3 months.  Nicotine abuse. -Discussed the importance of smoke cessation, spent for medicine discussion. - She has recently started Chantix, and has a quit date for 4 days.  Spent 3 minutes in discussion about quitting.  Orders Placed This Encounter  Procedures  . CT CHEST LUNG CA SCREEN LOW DOSE W/O CM  . Pulmonary Function Test [facility] Only   Return in about 3 months (around 04/21/2019).   Date: 01/20/2019  MRN# [id number] Lisa Avila [birth year]   Lisa Avila is a 63 y.o. old female seen in follow up for chief complaint of  Chief Complaint  Patient presents with  . Acute Visit    States she is having increased SOB, wheezing, and cough with green mucous for last 2 weeks.     HPI:  The patient is a 63 year old female smoker with COPD.  Last visit she was noted to  have progressive exertional dyspnea when working outside.  She has been maintained on Spiriva, Symbicort.  We discussed that she was already on maximal therapy, and I highly recommended smoke cessation.  She has been having a flare up, she got a course of augmentin, erythromycin, she is on a steroid course, now on 40 mg daily.  She is smoking about a ppd. She has just started chantix, her quit date is in 4 days.  She is using nebs once per day. She remains on symbicort, spiriva, singulair.   **ChestXray 12/30/18>> images personally reviewed, there is severe hyperinflation cw severe emphysema.  Imaging personally reviewed, low-dose CT chest 02/18/2018 in comparison with previous on 01/22/17, mild emphysematous changes have persisted, and have perhaps advanced slightly, a few scattered tiny nodules, follow-up CT was again recommended in 1 year.   Medication:    Current Outpatient Medications:  .  albuterol (PROVENTIL HFA;VENTOLIN HFA) 108 (90 BASE) MCG/ACT inhaler, Inhale into the lungs every 6 (six) hours as needed for wheezing or shortness of breath., Disp: , Rfl:  .  montelukast (SINGULAIR) 10 MG tablet, Take 10 mg by mouth daily., Disp: , Rfl: 12 .  sertraline (ZOLOFT) 50 MG tablet, Take 25 mg by mouth daily., Disp: , Rfl: 5 .  Spacer/Aero-Holding Chambers (AEROCHAMBER MV) inhaler, Use as instructed to aid with inhaled medications., Disp: 1 each, Rfl: 0 .  SYMBICORT 160-4.5 MCG/ACT inhaler, Inhale 2 puffs into the lungs  2 (two) times daily., Disp: , Rfl: 11 .  tiotropium (SPIRIVA) 18 MCG inhalation capsule, Place 1 capsule (18 mcg total) into inhaler and inhale daily., Disp: 10 capsule, Rfl: 0 .  traMADol (ULTRAM) 50 MG tablet, Take 1 tablet (50 mg total) by mouth every 6 (six) hours as needed., Disp: 20 tablet, Rfl: 0   Allergies:  Patient has no known allergies.   Review of Systems:  Constitutional: Feels well. Cardiovascular: Denies chest pain, exertional chest pain.  Pulmonary: Denies  hemoptysis, pleuritic chest pain.   The remainder of systems were reviewed and were found to be negative other than what is documented in the HPI.    Physical Examination:   VS: BP 120/80   Pulse 85   Ht 5\' 2"  (1.575 m)   Wt 131 lb (59.4 kg)   SpO2 95%   BMI 23.96 kg/m   General Appearance: No distress  Neuro:without focal findings, mental status, speech normal, alert and oriented HEENT: PERRLA, EOM intact Pulmonary: decreased air entry bilaterally.  CardiovascularNormal S1,S2.  No m/r/g.  Abdomen: Benign, Soft, non-tender, No masses Renal:  No costovertebral tenderness  GU:  No performed at this time. Endoc: No evident thyromegaly, no signs of acromegaly or Cushing features Skin:   warm, no rashes, no ecchymosis  Extremities: normal, no cyanosis, clubbing.    LABORATORY PANEL:   CBC No results for input(s): WBC, HGB, HCT, PLT in the last 168 hours. ------------------------------------------------------------------------------------------------------------------  Chemistries  No results for input(s): NA, K, CL, CO2, GLUCOSE, BUN, CREATININE, CALCIUM, MG, AST, ALT, ALKPHOS, BILITOT in the last 168 hours.  Invalid input(s): GFRCGP ------------------------------------------------------------------------------------------------------------------  Cardiac Enzymes No results for input(s): TROPONINI in the last 168 hours. ------------------------------------------------------------  RADIOLOGY:   No results found for this or any previous visit. No results found for this or any previous visit. ------------------------------------------------------------------------------------------------------------------  Thank  you for allowing [facility] to assist in the care of your patient. Our recommendations are noted above.  Please contact us if we can be of further service.  Marda Stalker, M.D., F.C.C.P.  Board Certified in Internal Medicine,  Pulmonary Medicine, [place], and Sleep Medicine.  [facility] Office Number: [phone]   01/20/2019

## 2019-01-20 NOTE — Patient Instructions (Addendum)
Use nebulizer more often, especially when you feel winded or wheezy.  Continue taking prednisone 40 mg daily, FOR ONE WEEK, then cut down by 10 mg daily until gone.   --Quitting smoking is the most important thing that you can do for your health.  --Quitting smoking will have greater affect on your health than any medicine that we can give you.   --The best way to quit is to set a quit date, usually a day that has meaning like someone's birthday, anniversary, or holiday.   --Start any medication prescribed for quitting one week before you quit date. Then toss out the cigarettes on your quit date.  --If you start smoking again, start from scratch--set another quit day and try again!

## 2019-01-21 ENCOUNTER — Telehealth: Payer: Self-pay

## 2019-01-21 NOTE — Telephone Encounter (Signed)
Call pt regarding lung screening. Pt is a current smoker, smoking about 1 pack per day. Pt would like any afternoon and would like if scan can be before Feb. 17 th. Pt is out of work until [employer].  Pt has been sick for over 1 month with  SOB. MD place her out of work until Feb.15th to see if her lungs would clear up.

## 2019-02-01 ENCOUNTER — Telehealth: Payer: Self-pay | Admitting: *Deleted

## 2019-02-01 ENCOUNTER — Encounter: Payer: Self-pay | Admitting: *Deleted

## 2019-02-01 DIAGNOSIS — Z122 Encounter for screening for malignant neoplasm of respiratory organs: Secondary | ICD-10-CM

## 2019-02-01 NOTE — Telephone Encounter (Signed)
Called pt to inform her of her appt for ldct screening on Monday 02/20/2019 here @ [place] @ 4:30pm, voiced understanding.

## 2019-02-01 NOTE — Telephone Encounter (Signed)
Patient has been notified that the annual lung cancer screening low dose CT scan is due currently or will be in the near future.  Confirmed that the patient is within the age range of 79-80, and asymptomatic, and currently exhibits no signs or symptoms of lung cancer.  Patient denies illness that would prevent curative treatment for lung cancer if found.  Verified smoking history, current smoker 1 ppd with 65pkyr history .  The shared decision making visit was completed on 03-08-15.  Patient is agreeable for the CT scan to be scheduled.  Will call patient back with date and time of appointment.

## 2019-02-20 ENCOUNTER — Ambulatory Visit
Admission: RE | Admit: 2019-02-20 | Discharge: 2019-02-20 | Disposition: A | Discharge status: Home / Self Care | Payer: 59 | Source: Ambulatory Visit | Attending: Nurse Practitioner | Admitting: Nurse Practitioner

## 2019-02-20 DIAGNOSIS — Z122 Encounter for screening for malignant neoplasm of respiratory organs: Secondary | ICD-10-CM | POA: Insufficient documentation

## 2019-02-22 ENCOUNTER — Encounter: Payer: Self-pay | Admitting: *Deleted

## 2019-02-23 ENCOUNTER — Telehealth: Payer: Self-pay | Admitting: Internal Medicine

## 2019-02-24 ENCOUNTER — Encounter: Payer: Self-pay | Admitting: *Deleted

## 2019-02-24 NOTE — Telephone Encounter (Signed)
No nodules or alarming findings. Only changes of emphysema.

## 2019-02-24 NOTE — Telephone Encounter (Signed)
Spoke to patient and gave her CT results. She stated she has been short of breath the last two days and wonders if there is anything else she can do. She is using symbicort and spiriva, and albuterol inhalers. States she has some prednisone from previous and has started taking 20 mg. Will send to [facility] for advise.

## 2019-02-24 NOTE — Telephone Encounter (Signed)
Pt called again for results.

## 2019-02-27 NOTE — Telephone Encounter (Signed)
At last visit she was not using nebs, so asked to use it more often. If not feeling well she should use three times per day. Also needs to stop smoking.

## 2019-02-28 NOTE — Telephone Encounter (Signed)
Spoke to patient, let her know of Dr. Enid Derry recommendation of using her nebs TID, she is aware, states she has been using BID. Also to stop smoking, which she states she is working on. She will contact us if this does not help. Nothing further needed at this time.

## 2019-04-11 ENCOUNTER — Ambulatory Visit: Payer: 59

## 2019-06-06 ENCOUNTER — Ambulatory Visit: Payer: 59

## 2019-06-12 ENCOUNTER — Ambulatory Visit: Payer: Self-pay | Admitting: Internal Medicine

## 2019-06-26 ENCOUNTER — Other Ambulatory Visit: Payer: Self-pay

## 2019-06-28 ENCOUNTER — Other Ambulatory Visit: Payer: Self-pay

## 2019-06-30 ENCOUNTER — Other Ambulatory Visit: Payer: Self-pay

## 2019-06-30 ENCOUNTER — Other Ambulatory Visit
Admission: RE | Admit: 2019-06-30 | Discharge: 2019-06-30 | Disposition: A | Discharge status: Home / Self Care | Payer: 59 | Source: Ambulatory Visit | Attending: Internal Medicine | Admitting: Internal Medicine

## 2019-06-30 DIAGNOSIS — Z1159 Encounter for screening for other viral diseases: Secondary | ICD-10-CM | POA: Diagnosis not present

## 2019-07-01 LAB — SARS CORONAVIRUS 2 (TAT 6-24 HRS): SARS Coronavirus 2: NEGATIVE

## 2019-07-04 ENCOUNTER — Ambulatory Visit: Payer: 59

## 2019-07-11 ENCOUNTER — Ambulatory Visit: Payer: 59 | Admitting: Internal Medicine

## 2019-08-03 ENCOUNTER — Other Ambulatory Visit (HOSPITAL_COMMUNITY): Payer: Self-pay | Admitting: Orthopedic Surgery

## 2019-08-03 ENCOUNTER — Other Ambulatory Visit: Payer: Self-pay | Admitting: Orthopedic Surgery

## 2019-08-03 DIAGNOSIS — M5441 Lumbago with sciatica, right side: Secondary | ICD-10-CM

## 2019-08-03 DIAGNOSIS — M5410 Radiculopathy, site unspecified: Secondary | ICD-10-CM

## 2019-08-03 DIAGNOSIS — G8929 Other chronic pain: Secondary | ICD-10-CM

## 2019-08-03 DIAGNOSIS — M5136 Other intervertebral disc degeneration, lumbar region: Secondary | ICD-10-CM

## 2019-08-15 ENCOUNTER — Ambulatory Visit
Admission: RE | Admit: 2019-08-15 | Discharge: 2019-08-15 | Disposition: A | Discharge status: Home / Self Care | Payer: 59 | Source: Ambulatory Visit | Attending: Orthopedic Surgery | Admitting: Orthopedic Surgery

## 2019-08-15 ENCOUNTER — Other Ambulatory Visit: Payer: Self-pay

## 2019-08-15 DIAGNOSIS — M5136 Other intervertebral disc degeneration, lumbar region: Secondary | ICD-10-CM | POA: Insufficient documentation

## 2019-08-15 DIAGNOSIS — M5441 Lumbago with sciatica, right side: Secondary | ICD-10-CM | POA: Diagnosis not present

## 2019-08-15 DIAGNOSIS — M5410 Radiculopathy, site unspecified: Secondary | ICD-10-CM

## 2019-08-15 DIAGNOSIS — G8929 Other chronic pain: Secondary | ICD-10-CM

## 2019-10-11 ENCOUNTER — Telehealth: Payer: Self-pay | Admitting: Internal Medicine

## 2019-10-11 NOTE — Telephone Encounter (Signed)
Pt requesting PFT appointment before the end of the year. LMOVM for Cardiopulmonary to return my call. Rhonda J Cobb

## 2019-10-11 NOTE — Telephone Encounter (Signed)
Lisa Avila, please advise. Thanks. 

## 2019-10-12 NOTE — Telephone Encounter (Signed)
PFT scheduled Tues 12/05/2019 at 10:15 at [facility]. Pt to arrive at 10:00. Rhonda J Cobb

## 2019-10-12 NOTE — Telephone Encounter (Signed)
Called and spoke with patient and she is aware of PFT date/time/location. 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  Appointment scheduled with Dr. Patsey Berthold for Monday 12/11/2019 at 10:45. Pt is aware and information mailed to patient. Pt informed that COVID test will be on Monday 12/04/2019, however, the nurse will contact her to arrange 4 days prior to PFT.   Appointment information mailed to patient along with PFT instructions. Nothing else needed at this time. Rhonda J Cobb

## 2019-11-30 ENCOUNTER — Other Ambulatory Visit: Payer: Self-pay

## 2019-11-30 ENCOUNTER — Telehealth: Payer: Self-pay

## 2019-11-30 NOTE — Telephone Encounter (Signed)
Pt is aware of date/time of covid test.   

## 2019-12-04 ENCOUNTER — Other Ambulatory Visit: Payer: Self-pay

## 2019-12-04 ENCOUNTER — Other Ambulatory Visit
Admission: RE | Admit: 2019-12-04 | Discharge: 2019-12-04 | Disposition: A | Discharge status: Home / Self Care | Payer: 59 | Source: Ambulatory Visit | Attending: Pulmonary Disease | Admitting: Pulmonary Disease

## 2019-12-04 DIAGNOSIS — Z20828 Contact with and (suspected) exposure to other viral communicable diseases: Secondary | ICD-10-CM | POA: Insufficient documentation

## 2019-12-04 DIAGNOSIS — Z01812 Encounter for preprocedural laboratory examination: Secondary | ICD-10-CM | POA: Insufficient documentation

## 2019-12-04 LAB — SARS CORONAVIRUS 2 (TAT 6-24 HRS): SARS Coronavirus 2: NEGATIVE

## 2019-12-05 ENCOUNTER — Other Ambulatory Visit: Payer: Self-pay

## 2019-12-05 ENCOUNTER — Ambulatory Visit: Payer: 59 | Attending: Internal Medicine

## 2019-12-05 DIAGNOSIS — J449 Chronic obstructive pulmonary disease, unspecified: Secondary | ICD-10-CM | POA: Diagnosis not present

## 2019-12-11 ENCOUNTER — Encounter: Payer: Self-pay | Admitting: Pulmonary Disease

## 2019-12-11 ENCOUNTER — Other Ambulatory Visit: Payer: Self-pay

## 2019-12-11 ENCOUNTER — Ambulatory Visit (INDEPENDENT_AMBULATORY_CARE_PROVIDER_SITE_OTHER): Payer: 59 | Admitting: Pulmonary Disease

## 2019-12-11 VITALS — BP 128/80 | HR 97 | Temp 97.0°F | Ht 62.0 in | Wt 138.6 lb

## 2019-12-11 DIAGNOSIS — J449 Chronic obstructive pulmonary disease, unspecified: Secondary | ICD-10-CM | POA: Diagnosis not present

## 2019-12-11 DIAGNOSIS — R0602 Shortness of breath: Secondary | ICD-10-CM

## 2019-12-11 DIAGNOSIS — F17210 Nicotine dependence, cigarettes, uncomplicated: Secondary | ICD-10-CM | POA: Diagnosis not present

## 2019-12-11 MED ORDER — TRELEGY ELLIPTA 100-62.5-25 MCG/INH IN AEPB: 1.0000 | INHALATION_SPRAY | Freq: Every day | RESPIRATORY_TRACT | 0 refills | Status: AC

## 2019-12-11 NOTE — Patient Instructions (Addendum)
1.  We are going to test your oxygen at nighttime  2.  We will also check a heart test to evaluate your shortness of breath  3.  You have a week supply of Trelegy let us know if that helps you more with your breathing.  Do not use Symbicort and Spiriva when you are using the Trelegy.  Let us know so that we can send that it to your pharmacy if it is effective.  4.  We will see him in follow-up in 3 months time call sooner should any new difficulties arise.

## 2019-12-18 ENCOUNTER — Telehealth: Payer: Self-pay | Admitting: Pulmonary Disease

## 2019-12-18 MED ORDER — TRELEGY ELLIPTA 100-62.5-25 MCG/INH IN AEPB: 1.0000 | INHALATION_SPRAY | Freq: Every day | RESPIRATORY_TRACT | 5 refills | Status: AC

## 2019-12-18 NOTE — Telephone Encounter (Signed)
Rx for trelegy has been sent to preferred pharmacy. Pt is aware and voiced her understanding. Nothing further is needed.

## 2019-12-25 ENCOUNTER — Ambulatory Visit
Admission: RE | Admit: 2019-12-25 | Discharge: 2019-12-25 | Disposition: A | Discharge status: Home / Self Care | Payer: 59 | Source: Ambulatory Visit | Attending: Pulmonary Disease | Admitting: Pulmonary Disease

## 2019-12-25 ENCOUNTER — Other Ambulatory Visit: Payer: Self-pay

## 2019-12-25 DIAGNOSIS — J449 Chronic obstructive pulmonary disease, unspecified: Secondary | ICD-10-CM | POA: Insufficient documentation

## 2019-12-25 DIAGNOSIS — I517 Cardiomegaly: Secondary | ICD-10-CM | POA: Diagnosis not present

## 2019-12-25 DIAGNOSIS — R0602 Shortness of breath: Secondary | ICD-10-CM

## 2019-12-25 NOTE — Progress Notes (Signed)
*  PRELIMINARY RESULTS* Echocardiogram 2D Echocardiogram has been performed.  Sherrie Sport 12/25/2019, 11:41 AM

## 2020-01-08 ENCOUNTER — Telehealth: Payer: Self-pay | Admitting: Pulmonary Disease

## 2020-01-08 NOTE — Telephone Encounter (Signed)
Pt is aware that all disability forms have to go thru [employer]. I have provided pt with [employer]'s contact number.  Nothing further is needed.

## 2020-01-12 ENCOUNTER — Telehealth: Payer: Self-pay | Admitting: Pulmonary Disease

## 2020-01-12 ENCOUNTER — Other Ambulatory Visit: Payer: Self-pay

## 2020-01-12 DIAGNOSIS — J449 Chronic obstructive pulmonary disease, unspecified: Secondary | ICD-10-CM

## 2020-01-12 MED ORDER — TRELEGY ELLIPTA 100-62.5-25 MCG/INH IN AEPB: 1.0000 | INHALATION_SPRAY | Freq: Every day | RESPIRATORY_TRACT | 2 refills | Status: AC

## 2020-01-12 NOTE — Telephone Encounter (Signed)
ONO reviewed by Dr. Patsey Berthold- recommend 2L QHS. Pt is aware of results and wished to proceed with oxygen. Order has been placed. Nothing further is needed.

## 2020-01-17 ENCOUNTER — Encounter: Payer: Self-pay | Admitting: Pulmonary Disease

## 2020-01-17 NOTE — Progress Notes (Signed)
Subjective:    Patient ID: Lisa Avila, female    DOB: 07/11/1956, 64 y.o.   MRN: [id number]  HPI This is a 64 year old current smoker (1.5 PPD) who presents for follow-up on the issue of stage IV COPD.  She has a prior patient of Dr. Ashby Dawes, I am assuming care due to Dr. Mathis Fare departure.  The patient states that she was out of work for 1 month between February/March 2020 believed to have been due to COVID-19 not tested at that time due to lack of availability of testing at that time.  She was slow to recover and saw Dr. Ashby Dawes in October and December 2020 with persistent symptoms of worsening dyspnea particularly when working outside.  She is currently maintained on Symbicort 160/4.5, 2 inhalations twice a day and Spiriva HandiHaler 1 capsule inhaled daily.  She is also on Singulair and as needed albuterol.  She has tried multiple modalities to engage in smoking cessation but all have failed.  Chantix gave her nausea and vomiting and bupropion did not help at all.  She has not used nicotine replacement patches consistently.  Recently she has not had any fevers, chills or sweats.  Cough is baseline and mostly in the mornings productive of whitish to grayish sputum.  No hemoptysis.  She has not had any orthopnea or paroxysmal nocturnal dyspnea.  As noted she does endorse worsening exertional dyspnea over the last 6 months.  She had low-dose lung cancer screening CT scan in 02/20/2019 she is to follow in 1 year, no unusual findings at that time.  I have reviewed all of her medications, of note she has been on gabapentin for at least 6 months.  Review of Systems A 10 point review of systems was performed and it is as noted above otherwise negative.    Objective:   Physical Exam BP 128/80 (BP Location: Left Arm, Cuff Size: Normal)   Pulse 97   Temp (!) 97 F (36.1 C) (Temporal)   Ht 5\' 2"  (1.575 m)   Wt 138 lb 9.6 oz (62.9 kg)   SpO2 96%   BMI 25.35 kg/m   GENERAL:  Awake and alert, well-developed well-nourished. HEAD: Normocephalic, atraumatic.  EYES: Pupils equal, round, reactive to light.  No scleral icterus.  MOUTH: Nose/mouth/throat not examined due to masking requirements for COVID 19. NECK: Supple. No thyromegaly. No nodules. No JVD.  Trachea midline, phonation normal. PULMONARY: Lungs distant coarse breath sounds throughout.  No wheezes or rhonchi noted. CARDIOVASCULAR: S1 and S2. Regular rate and rhythm. GASTROINTESTINAL: Nondistended abdomen. MUSCULOSKELETAL: No joint swelling, clubbing, or edema.  NEUROLOGIC: Awake and alert, no overt focal deficit, gait normal, fluent speech. SKIN: Intact,warm,dry PSYCH: Cooperative, calm   Representative image from low-dose CT performed 02/20/2019, independently reviewed:     PFTs 12/05/2019:  FEV1 = 0.65 L, 28%  FVC =1.58 L, 52% FEV1/FVC =41% Net change postbronchodilator =20% Air trapping present RV =123% DLCO =37%  Consistent with severe to very severe obstructive lung disease, decrease in diffusion capacity consistent with emphysema.  Ambulatory oximetry: Performed today Baseline O2 sat 95% desaturated to 91% during ambulation but no further.  Heart rate increased from 106 to 120.    Assessment & Plan:   Stage IV COPD, very severe, by GOLD classification Trial of Trelegy Ellipta 1 inhalation daily Stop Symbicort and Spiriva while on Trelegy She is to report on how Trelegy is working for her Follow-up in 3 months time she is to contact us prior to that  time should any new difficulties arise  Dyspnea/shortness of breath 2D echo to evaluate for potential pulmonary hypertension/cor pulmonale Ambulatory oximetry did not show severe desaturations Overnight oximetry to rule out nocturnal hypoxemia associated with COPD  Tobacco dependence due to cigarettes Patient was counseled regards to discontinuation of smoking Total counseling time 3 to 5 minutes Discussed proper use of nicotine  replacement patches She is to try nicotine replacement patches again   C. Derrill Kay, MD [facility]   *This note was dictated using voice recognition software/Dragon.  Despite best efforts to proofread, errors can occur which can change the meaning.  Any change was purely unintentional.

## 2020-01-26 ENCOUNTER — Telehealth: Payer: Self-pay

## 2020-01-26 NOTE — Telephone Encounter (Signed)
Received disability paperwork from [facility] [employer].  Faxed papers to [employer],

## 2020-02-05 ENCOUNTER — Telehealth: Payer: Self-pay | Admitting: Pulmonary Disease

## 2020-02-05 NOTE — Telephone Encounter (Signed)
Spoke to pt, who is requesting update on disability forms.  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  I advised pt that our office will contact her once forms have been completed.

## 2020-02-05 NOTE — Telephone Encounter (Signed)
Disability forms have been completed and sent back to [employer] via interoffice.  Pt is aware and voiced her understanding.  Nothing further is needed.

## 2020-02-08 ENCOUNTER — Telehealth: Payer: Self-pay | Admitting: *Deleted

## 2020-02-08 DIAGNOSIS — Z87891 Personal history of nicotine dependence: Secondary | ICD-10-CM

## 2020-02-08 NOTE — Telephone Encounter (Signed)
(  02/08/20) Patient has been notified that lung cancer screening CT scan is due currently or will be in near future. Confirmed that patient is within the appropriate age range, and asymptomatic, but has begun to use supplemental O2 @ night per MD advisement. Patient denies illness that would prevent curative treatment for lung cancer if found. Verified smoking history (Current Smoker,1.5 ppd ). Patient is agreeable for CT scan being scheduled, and prefers an afternoon appt SRW

## 2020-02-12 NOTE — Addendum Note (Signed)
Addended by: Lieutenant Diego on: 02/12/2020 11:51 AM   Modules accepted: Orders

## 2020-02-12 NOTE — Telephone Encounter (Signed)
Smoking history: current, 66.5 pack year

## 2020-02-22 ENCOUNTER — Ambulatory Visit
Admission: RE | Admit: 2020-02-22 | Discharge: 2020-02-22 | Disposition: A | Discharge status: Home / Self Care | Payer: BC Managed Care – PPO | Source: Ambulatory Visit | Attending: Oncology | Admitting: Oncology

## 2020-02-22 ENCOUNTER — Other Ambulatory Visit: Payer: Self-pay

## 2020-02-22 DIAGNOSIS — Z87891 Personal history of nicotine dependence: Secondary | ICD-10-CM

## 2020-02-23 ENCOUNTER — Telehealth: Payer: Self-pay | Admitting: *Deleted

## 2020-02-23 NOTE — Telephone Encounter (Signed)
After obtaining the opinion from pulmonologist, notified patient of LDCT lung cancer screening program results with recommendation for 3 month follow up imaging. Also notified of incidental findings noted below and is encouraged to discuss further with PCP who will receive a copy of this note and/or the CT report. Patient verbalizes understanding.   IMPRESSION: 1. Lung-RADS 4BS, suspicious. Additional imaging evaluation or consultation with Pulmonology or Thoracic Surgery recommended. 2. The "S" modifier above refers to potentially clinically significant non lung cancer related findings. Specifically, there is aortic atherosclerosis, in addition to 2 vessel coronary artery disease. Please note that although the presence of coronary artery calcium documents the presence of coronary artery disease, the severity of this disease and any potential stenosis cannot be assessed on this non-gated CT examination. Assessment for potential risk factor modification, dietary therapy or pharmacologic therapy may be warranted, if clinically indicated. 3. Mild diffuse bronchial wall thickening with mild centrilobular and paraseptal emphysema; imaging findings suggestive of underlying COPD.  Aortic Atherosclerosis (ICD10-I70.0) and Emphysema (ICD10-J43.9).

## 2020-02-23 NOTE — Progress Notes (Signed)
Just wanted to make sure you saw this.   Faythe Casa, NP 02/23/2020 9:48 AM

## 2020-03-19 LAB — CBC AND DIFFERENTIAL
HCT: 48 — AB (ref 36–46)
Hemoglobin: 16.4 — AB (ref 12.0–16.0)
Neutrophils Absolute: 4.6
Platelets: 280 10*3/uL (ref 150–400)
WBC: 6.8

## 2020-03-19 LAB — BASIC METABOLIC PANEL
BUN: 9 (ref 4–21)
CO2: 24 — AB (ref 13–22)
Chloride: 104 (ref 99–108)
Creatinine: 0.8 (ref 0.5–1.1)
Glucose: 68
Potassium: 4.4 mEq/L (ref 3.5–5.1)
Sodium: 140 (ref 137–147)

## 2020-03-19 LAB — LIPID PANEL
Cholesterol: 214 — AB (ref 0–200)
HDL: 50 (ref 35–70)
LDL Cholesterol: 142
Triglycerides: 125 (ref 40–160)

## 2020-03-19 LAB — HEPATIC FUNCTION PANEL
AST: 23 (ref 13–35)
Alkaline Phosphatase: 121 (ref 25–125)
Bilirubin, Total: 0.2

## 2020-03-19 LAB — COMPREHENSIVE METABOLIC PANEL
Albumin: 4.4 (ref 3.5–5.0)
Calcium: 9.5 (ref 8.7–10.7)
Globulin: 2
eGFR: 80

## 2020-03-19 LAB — CBC: RBC: 5.22 — AB (ref 3.87–5.11)

## 2020-05-08 ENCOUNTER — Telehealth: Payer: Self-pay | Admitting: *Deleted

## 2020-05-08 DIAGNOSIS — Z87891 Personal history of nicotine dependence: Secondary | ICD-10-CM

## 2020-05-08 DIAGNOSIS — R918 Other nonspecific abnormal finding of lung field: Secondary | ICD-10-CM

## 2020-05-08 NOTE — Telephone Encounter (Signed)
Contacted and scheduled for LCS nodule follow up scan 

## 2020-05-22 ENCOUNTER — Ambulatory Visit
Admission: RE | Admit: 2020-05-22 | Discharge: 2020-05-22 | Disposition: A | Discharge status: Home / Self Care | Payer: Self-pay | Source: Ambulatory Visit | Attending: Oncology | Admitting: Oncology

## 2020-05-22 ENCOUNTER — Other Ambulatory Visit: Payer: Self-pay

## 2020-05-22 DIAGNOSIS — Z87891 Personal history of nicotine dependence: Secondary | ICD-10-CM

## 2020-05-22 DIAGNOSIS — R918 Other nonspecific abnormal finding of lung field: Secondary | ICD-10-CM

## 2020-05-24 ENCOUNTER — Encounter: Payer: Self-pay | Admitting: *Deleted

## 2020-10-02 ENCOUNTER — Telehealth: Payer: Self-pay | Admitting: Pulmonary Disease

## 2020-10-02 NOTE — Telephone Encounter (Signed)
Received release of medical records from [facility]. Will send to medical records for processing.

## 2020-10-15 ENCOUNTER — Encounter: Payer: Self-pay | Admitting: Pulmonary Disease

## 2020-10-15 ENCOUNTER — Ambulatory Visit (INDEPENDENT_AMBULATORY_CARE_PROVIDER_SITE_OTHER): Payer: Self-pay | Admitting: Pulmonary Disease

## 2020-10-15 ENCOUNTER — Other Ambulatory Visit: Payer: Self-pay

## 2020-10-15 VITALS — BP 104/68 | HR 90 | Temp 97.3°F | Ht 62.0 in | Wt 141.4 lb

## 2020-10-15 DIAGNOSIS — J449 Chronic obstructive pulmonary disease, unspecified: Secondary | ICD-10-CM

## 2020-10-15 DIAGNOSIS — G4736 Sleep related hypoventilation in conditions classified elsewhere: Secondary | ICD-10-CM

## 2020-10-15 DIAGNOSIS — J439 Emphysema, unspecified: Secondary | ICD-10-CM

## 2020-10-15 DIAGNOSIS — F17210 Nicotine dependence, cigarettes, uncomplicated: Secondary | ICD-10-CM

## 2020-10-15 DIAGNOSIS — Z23 Encounter for immunization: Secondary | ICD-10-CM

## 2020-10-15 NOTE — Progress Notes (Signed)
Subjective:    Patient ID: Lisa Avila, female    DOB: 02-26-1956, 64 y.o.   MRN: [id number]  HPI This is a 64 year old current smoker (1 PPD) who presents for follow-up on the issue of COPD and nocturnal hypoxemia.  She has not been seen since December 2020.  She previously followed with Dr. Ashby Dawes.  Set of December 2020 she was initiated on Trelegy Ellipta and notes that this medication helps her dramatically.  She is on 2-1/2 L of oxygen nocturnally for significant nocturnal desaturations.  She has been compliant with her nocturnal oxygen supplementation.  Since her last visit the patient has not had any admissions or COPD exacerbations.  She has not had any increase in dyspnea from baseline and no change in the quality of cough or sputum production.  She is enrolled lung cancer screening program findings noted.   DATA 12/05/2019 PFT: FEV1 0.65 L or 28% predicted, FVC 1.58 L or 52% predicted, FEV1/FVC 41%.  Postbronchodilator FEV1 with 20% no change and FVC with 14% no change indicating a degree of airway obstruction reversibility.  There is air trapping noted.  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 12/25/2019 2D echo: LVEF 60 to 65%.  Borderline LVH.  Abnormalities.  Mildly elevated pulmonary artery systolic pressure.  RV normal. 05/22/2020 low-dose chest CT: Multiple small lung nodules, benign in appearance, diffuse bronchial wall thickening and centrilobular and paraseptal emphysema  Review of Systems A 10 point review of systems was performed and it is as noted above otherwise negative.   No Known Allergies   Current Meds  Medication Sig  . albuterol (PROVENTIL HFA;VENTOLIN HFA) 108 (90 BASE) MCG/ACT inhaler Inhale into the lungs every 6 (six) hours as needed for wheezing or shortness of breath.  . Fluticasone-Umeclidin-Vilant (TRELEGY ELLIPTA) 100-62.5-25 MCG/INH AEPB   .  sertraline (ZOLOFT) 50 MG tablet Take 25 mg by mouth daily.  Marland Kitchen Spacer/Aero-Holding Chambers (AEROCHAMBER MV) inhaler Use as instructed to aid with inhaled medications.  . traMADol (ULTRAM) 50 MG tablet Take 1 tablet (50 mg total) by mouth every 6 (six) hours as needed.   Immunization History  Administered Date(s) Administered  . Influenza Split 10/29/2013, 11/13/2014  . Influenza,inj,Quad PF,6+ Mos 10/03/2018, 10/15/2020  . Influenza-Unspecified 10/09/2019  . PFIZER SARS-COV-2 Vaccination 07/13/2020, 08/03/2020  . Pneumococcal Conjugate-13 11/13/2014  . Tdap 08/11/2016   Social History   Tobacco Use  . Smoking status: Current Every Day Smoker    Packs/day: 1.50    Years: 44.00    Pack years: 66.00    Types: Cigarettes  . Smokeless tobacco: Never Used  . Tobacco comment: 1 ppd / RM,CMA 10/15/20  Substance Use Topics  . Alcohol use: No    Alcohol/week: 0.0 standard drinks       Objective:   Physical Exam BP 104/68 (BP Location: Left Arm, Patient Position: Sitting, Cuff Size: Normal)   Pulse 90   Temp (!) 97.3 F (36.3 C) (Temporal)   Ht 5\' 2"  (1.575 m)   Wt 141 lb 6.4 oz (64.1 kg)   SpO2 98%   BMI 25.86 kg/m  GENERAL: Well-developed well-nourished woman, no acute distress.  She is fully ambulatory.  Some minimal use of accessories of respiration. HEAD: Normocephalic, atraumatic.  EYES: Pupils equal, round, reactive to light.  No scleral icterus.  MOUTH: Nose/mouth/throat not examined due to masking requirements for COVID 19. NECK: Supple. No thyromegaly. Trachea midline. No JVD.  No adenopathy. PULMONARY: Good air entry bilaterally.  No adventitious sounds. CARDIOVASCULAR: S1 and S2. Regular rate and rhythm.  No rubs, murmurs or gallops heard. ABDOMEN: Benign. MUSCULOSKELETAL: No joint deformity, no clubbing, no edema.  NEUROLOGIC: Awake and alert, no overt focal deficit, fluent speech, gait normal. SKIN: Intact,warm,dry. PSYCH: Mood and behavior normal.  Ambulatory  oximetry was performed today: Patient ambulated over 750 feet at moderate pace, noted dyspnea however no desaturations noted.  97% O2 sat at rest, 93% during exercise.    Assessment & Plan:     ICD-10-CM   1. 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 Nocturnal hypoxemia due to emphysema ([place])  J43.9    G47.36    Reassess with ONOX on room air  3. Need for immunization against influenza  Z23 Flu Vaccine QUAD 36+ mos IM   Received flu vaccine today  4. Tobacco dependence due to cigarettes  F17.210    Patient counseled regards to discontinuation of smoking Total counseling time 3 to 5 minutes    Orders Placed This Encounter  Procedures  . Flu Vaccine QUAD 36+ mos IM  . Pulse oximetry, overnight    On roomair.  KPT:WSFKC    Standing Status:   Future    Standing Expiration Date:   10/15/2021   Smoking cessation instruction/counseling given:  counseled patient on the dangers of tobacco use, advised patient to stop smoking, and reviewed strategies to maximize success  Discussion:  Patient has stage IV COPD, she has significant airways reversibility therefore asthma/COPD overlap.  She is to continue Trelegy Ellipta she appears to be doing well on this medication.  She was counseled regards to discontinuation of smoking.  She does have nocturnal hypoxemia we will reassess ongoing need for oxygen.  Follow-up in 6 months time she is to contact us prior to that time should any new difficulties arise.  Renold Don, MD [facility]   *This note was dictated using voice recognition software/Dragon.  Despite best efforts to proofread, errors can occur which can change the meaning.  Any change was purely unintentional.

## 2020-10-15 NOTE — Patient Instructions (Signed)
Your walk test today was good.  Continue Trelegy 1 puff daily  You received your flu vaccine today  We are going to order an overnight oxygen level on room air  We will see you in follow-up in 6 months time

## 2020-10-17 ENCOUNTER — Encounter: Payer: Self-pay | Admitting: Pulmonary Disease

## 2020-10-24 ENCOUNTER — Telehealth: Payer: Self-pay | Admitting: Pulmonary Disease

## 2020-10-24 NOTE — Telephone Encounter (Signed)
Spoke to patient and relayed below recommendations.  Patient stated that she is currently uninsured. She will call back once insured to scheduled HST. Nothing further needed.    Routing to Dr. Patsey Berthold as an Juluis Rainier.

## 2020-10-24 NOTE — Telephone Encounter (Signed)
ONO reviewed by Dr. Dietrich Pates in lab or HST.   Lm for patient.

## 2020-11-12 ENCOUNTER — Telehealth: Payer: Self-pay | Admitting: Pulmonary Disease

## 2020-11-12 MED ORDER — TRELEGY ELLIPTA 100-62.5-25 MCG/INH IN AEPB: 1.0000 | INHALATION_SPRAY | Freq: Every day | RESPIRATORY_TRACT | 11 refills | Status: DC

## 2020-11-12 NOTE — Telephone Encounter (Signed)
Called and spoke to patient. Patient is requesting for trelegy Rx to be faxed to [facility] at [phone].  Rx has been printed and placed in Dr. Domingo Dimes folder for signature.

## 2020-11-14 MED ORDER — TRELEGY ELLIPTA 100-62.5-25 MCG/INH IN AEPB: 1.0000 | INHALATION_SPRAY | Freq: Every day | RESPIRATORY_TRACT | 0 refills | Status: DC

## 2020-11-14 NOTE — Telephone Encounter (Signed)
Patient is aware that Rx will be faxed to [facility] once signed by MD.  She would like one month supply to be sent to [facility] to get her by until she receives shipment from [place]. Rx has been sent to preferred pharmacy.

## 2020-11-14 NOTE — Telephone Encounter (Signed)
Pt called and stated [place] has not re'x rx yet and she is out of trelegy .Pt states rx needs to include her pt ID [phone]. Please advise.

## 2020-11-14 NOTE — Telephone Encounter (Signed)
Rx has been faxed to [facility]. Patient is aware and voiced her understanding.  Nothing further is needed.

## 2021-02-07 ENCOUNTER — Telehealth: Payer: Self-pay | Admitting: Pulmonary Disease

## 2021-02-07 NOTE — Telephone Encounter (Signed)
Lm for patient.  Patient should only wear oxygen at night.

## 2021-02-07 NOTE — Telephone Encounter (Signed)
Called and spoke to patient.  Patient is requesting POC, as she occasionally wears 2L during the day.  She is not monitoring oxygen levels.  She is aware that oxygen is prescribed at night only. Advised patient that insurance will not cover oxygen without a walk test.  Patient is uninsured and would like to purchase a POC out of pocket.   Dr. Patsey Berthold, please advise. thanks

## 2021-02-10 NOTE — Telephone Encounter (Signed)
Even if she buys it out-of-pocket she needs to have a walk test.  The companies that provide POC's will not apply them unless it is medically necessary.

## 2021-02-10 NOTE — Telephone Encounter (Signed)
Patient is aware of below message.  She voiced her understanding and had no further questions.  Nothing further needed.

## 2021-04-01 ENCOUNTER — Other Ambulatory Visit: Payer: Self-pay

## 2021-04-01 ENCOUNTER — Ambulatory Visit (INDEPENDENT_AMBULATORY_CARE_PROVIDER_SITE_OTHER): Payer: Self-pay | Admitting: Pulmonary Disease

## 2021-04-01 ENCOUNTER — Encounter: Payer: Self-pay | Admitting: Pulmonary Disease

## 2021-04-01 VITALS — BP 110/70 | HR 89 | Temp 97.8°F | Ht 62.0 in | Wt 139.8 lb

## 2021-04-01 DIAGNOSIS — F17210 Nicotine dependence, cigarettes, uncomplicated: Secondary | ICD-10-CM

## 2021-04-01 DIAGNOSIS — J449 Chronic obstructive pulmonary disease, unspecified: Secondary | ICD-10-CM

## 2021-04-01 DIAGNOSIS — J439 Emphysema, unspecified: Secondary | ICD-10-CM

## 2021-04-01 DIAGNOSIS — G4736 Sleep related hypoventilation in conditions classified elsewhere: Secondary | ICD-10-CM

## 2021-04-01 MED ORDER — TRELEGY ELLIPTA 200-62.5-25 MCG/INH IN AEPB: 200.0000 ug | INHALATION_SPRAY | Freq: Every day | RESPIRATORY_TRACT | 0 refills | Status: DC

## 2021-04-01 NOTE — Patient Instructions (Addendum)
We are increasing the strength of your Trelegy to the 200.   You are receiving 1 month supply.  Let us know how you do with this.  If you notice after the first 2 weeks (first box) that you are doing well let us and [employer] know so that we can send the prescription into them.  We will see you in follow-up in 2 months time call sooner should any new problems arise.

## 2021-04-01 NOTE — Progress Notes (Signed)
Subjective:    Patient ID: Lisa Avila, female    DOB: 03-16-1956, 65 y.o.   MRN: [id number]  HPI Is a 65 year old current smoker (1.5 PPD) who presents for follow-up on the issue of asthma COPD overlap and nocturnal hypoxemia secondary to the same.  Last seen here in October 2021.  This is a scheduled visit.  She is compliant with nocturnal oxygen and notes that she does well with it and notes more stamina during the day.  She has had issues with increased congestion due to environmental allergies (pollen).  Her last PFTs were in 2020 however she has not been able to get PFTs again due to cost as she does not have reliable insurance coverage.  She is enrolled in low-dose CT lung cancer screening.  Has not had any fevers, chills or sweats.  No weight loss or anorexia.  No purulent sputum production.  No hemoptysis.  No orthopnea, paroxysmal nocturnal dyspnea, chest pain, leg swelling.  DATA 12/05/2019 PFT: FEV1 0.65 L or 28% predicted, FVC 1.58 L or 52% predicted, FEV1/FVC 41%. Postbronchodilator FEV1 with 20% no change and FVC with 14% no change indicating a degree of airway obstruction reversibility.  There is air trapping noted.  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 12/25/2019 2D echo: LVEF 60 to 65%.  Borderline LVH.  Abnormalities.  Mildly elevated pulmonary artery systolic pressure.  RV normal. 05/22/2020 low-dose chest CT: Multiple small lung nodules, benign in appearance, diffuse bronchial wall thickening and centrilobular and paraseptal emphysema  Review of Systems A 10 point review of systems was performed and it is as noted above otherwise negative.  Patient Active Problem List   Diagnosis Date Noted  . COPD GOLD C 02/26/2014  . Cough 02/26/2014  . Tobacco abuse 02/26/2014   Social History   Tobacco Use  . Smoking status: Current Every Day Smoker    Packs/day: 1.50     Years: 44.00    Pack years: 66.00    Types: Cigarettes  . Smokeless tobacco: Never Used  . Tobacco comment: currently smoking 1.5 pack a day  Substance Use Topics  . Alcohol use: No    Alcohol/week: 0.0 standard drinks   No Known Allergies  Current Meds  Medication Sig  . albuterol (PROVENTIL HFA;VENTOLIN HFA) 108 (90 BASE) MCG/ACT inhaler Inhale into the lungs every 6 (six) hours as needed for wheezing or shortness of breath.  . Fluticasone-Umeclidin-Vilant (TRELEGY ELLIPTA) 100-62.5-25 MCG/INH AEPB Inhale 1 puff into the lungs daily.  . sertraline (ZOLOFT) 50 MG tablet Take 25 mg by mouth daily.  Marland Kitchen Spacer/Aero-Holding Chambers (AEROCHAMBER MV) inhaler Use as instructed to aid with inhaled medications.  . traMADol (ULTRAM) 50 MG tablet Take 1 tablet (50 mg total) by mouth every 6 (six) hours as needed.   Immunization History  Administered Date(s) Administered  . Influenza Split 10/29/2013, 11/13/2014  . Influenza,inj,Quad PF,6+ Mos 10/03/2018, 10/15/2020  . Influenza-Unspecified 10/09/2019  . PFIZER(Purple Top)SARS-COV-2 Vaccination 07/13/2020, 08/03/2020  . Pneumococcal Conjugate-13 11/13/2014  . Tdap 08/11/2016       Objective:   Physical Exam BP 110/70 (BP Location: Left Arm, Patient Position: Sitting, Cuff Size: Normal)   Pulse 89   Temp 97.8 F (36.6 C) (Temporal)   Ht 5\' 2"  (1.575 m)   Wt 139 lb 12.8 oz (63.4 kg)   SpO2 94%   BMI 25.57 kg/m  GENERAL: Well-developed well-nourished woman, no acute distress.  She  is fully ambulatory.    Mild use of accessories of respiration. HEAD: Normocephalic, atraumatic.  EYES: Pupils equal, round, reactive to light.  No scleral icterus.  MOUTH: Nose/mouth/throat not examined due to masking requirements for COVID 19. NECK: Supple. No thyromegaly. Trachea midline. No JVD.  No adenopathy. PULMONARY: Good air entry bilaterally.  Coarse breath sounds otherwise , no adventitious sounds. CARDIOVASCULAR: S1 and S2. Regular rate and  rhythm.  No rubs, murmurs or gallops heard. ABDOMEN: Benign. MUSCULOSKELETAL: No joint deformity, no clubbing, no edema.  NEUROLOGIC: Awake and alert, no overt focal deficit, fluent speech, gait normal. SKIN: Intact,warm,dry. PSYCH: Mood and behavior normal.  Ambulatory oximetry was performed today: At rest oxygen saturation was 96% nadir was 89% and recovered very quickly to baseline.    Assessment & Plan:     ICD-10-CM   1. Stage 4 very severe COPD by GOLD classification ([place])  J44.9 Fluticasone-Umeclidin-Vilant (TRELEGY ELLIPTA) 200-62.5-25 MCG/INH AEPB   She has COPD with asthma overlap COPD is very severe Will increase Trelegy to 200/62.5/25 Samples provided  2. Nocturnal hypoxemia due to emphysema (HCC)  J43.9    G47.36    Continue oxygen at 2 L/min Did not qualify for portable oxygen source  3. Tobacco dependence due to cigarettes  F17.210    Patient counseled extensively regards discontinuation of smoking Counseling time 3 to 5 minutes Patient is enrolled in low-dose CT lung cancer screening   Meds ordered this encounter  Medications  . Fluticasone-Umeclidin-Vilant (TRELEGY ELLIPTA) 200-62.5-25 MCG/INH AEPB    Sig: Inhale 200 mcg into the lungs daily.    Dispense:  28 each    Refill:  0    Order Specific Question:   Lot Number?    Answer:   [id number]    Order Specific Question:   Expiration Date?    Answer:   10/26/2022    Order Specific Question:   Quantity    Answer:   2   Discussion:  Patient has severe COPD with asthma overlap.  Unfortunately continues to smoke.  Has been having some mild increased congestion due to allergy component.  She has a 20% reversibility in FEV1 noted on prior PFT.  We will increase Trelegy to 200/62.5/25 1 inhalation daily.  She is to let us know how this works for her.  She is to follow-up in 2 months time she is to contact us prior to that time should any new difficulties arise.  Renold Don, MD [facility]   *This note was  dictated using voice recognition software/Dragon.  Despite best efforts to proofread, errors can occur which can change the meaning.  Any change was purely unintentional.

## 2021-04-16 ENCOUNTER — Telehealth: Payer: Self-pay | Admitting: Pulmonary Disease

## 2021-04-16 DIAGNOSIS — J449 Chronic obstructive pulmonary disease, unspecified: Secondary | ICD-10-CM

## 2021-04-16 MED ORDER — TRELEGY ELLIPTA 200-62.5-25 MCG/INH IN AEPB: 200.0000 ug | INHALATION_SPRAY | Freq: Every day | RESPIRATORY_TRACT | 11 refills | Status: DC

## 2021-04-16 NOTE — Telephone Encounter (Signed)
Spoke to patient, who is requesting Rx for trelegy 200, as she feels that this medication is effective.  Rx will need to be faxed to [facility] at [phone]. Rx has been been printed and placed in Dr. Domingo Dimes folder for signature.

## 2021-04-16 NOTE — Telephone Encounter (Signed)
Rx has been signed and faxed to [facility] at provided fax number.  Received fax confirmation.  Nothing further needed at this time.

## 2021-05-13 ENCOUNTER — Telehealth: Payer: Self-pay

## 2021-05-13 NOTE — Telephone Encounter (Signed)
Received medical record request from [employer]. Request has been faxed to medical records.

## 2021-05-21 ENCOUNTER — Telehealth: Payer: Self-pay

## 2021-05-21 NOTE — Telephone Encounter (Signed)
Left message for patient to notify them that it is time to schedule annual low dose lung cancer screening CT scan. Instructed patient to call back ([phone]) to verify information and schedule.  

## 2021-05-30 ENCOUNTER — Telehealth: Payer: Self-pay | Admitting: Pulmonary Disease

## 2021-05-30 NOTE — Telephone Encounter (Signed)
Spoke to [place] with [employer].  Lisa Avila is questioning if Dr. Patsey Berthold thinks patient should have work restrictions due to her lung disease. Lisa Avila also wants to know if Dr. Patsey Berthold plans to order another PFT. Last PFT was 11/2019.  Dr. Patsey Berthold, please advise. Thanks

## 2021-06-02 NOTE — Telephone Encounter (Signed)
Lisa Avila is aware of below message and voiced her understanding.  Patient will be re evaluated at up coming appt on 06/18/2021. Nothing further needed at this time.

## 2021-06-02 NOTE — Telephone Encounter (Signed)
I saw the patient in April and she was very symptomatic.  They need to read my last note.  She had declined PFTs due to cost.  She also could not be scheduled for PFTs when she was symptomatic.  She has been scheduled to follow-up with me 2 months from that date in April.  Recommendations with regards to her work were not offered at the time because that was not a question posed to me during the time that she presented for a visit.  I would have to reevaluate her for this issue on follow-up.  The patient also needs to quit smoking.

## 2021-06-02 NOTE — Telephone Encounter (Signed)
Lisa Avila from [facility] is returning phone call. Sharyn Lull phone number is [phone].

## 2021-06-02 NOTE — Telephone Encounter (Signed)
Lm for [name] with [employer].  

## 2021-06-17 ENCOUNTER — Ambulatory Visit: Payer: Medicaid Other | Admitting: Pulmonary Disease

## 2021-06-25 ENCOUNTER — Telehealth: Payer: Self-pay

## 2021-06-25 ENCOUNTER — Telehealth: Payer: Self-pay | Admitting: Pulmonary Disease

## 2021-06-25 NOTE — Telephone Encounter (Signed)
Left message for patient to notify them that it is time to schedule annual low dose lung cancer screening CT scan. Instructed patient to call back ([phone]) to verify information and schedule.  

## 2021-06-25 NOTE — Telephone Encounter (Signed)
Rec'd disability forms - Per Dr. Domingo Dimes last telephone encounter- pt needs to be seen to discuss any kind of work restrictions or disability - I have requested that this form and notification are faxed back to [facility] by Lenna Sciara at our [place] office. Lenna Sciara will also hold forms until patient is seen on 07/29/2021 - discuss paperwork charge. -pr

## 2021-07-07 ENCOUNTER — Telehealth: Payer: Self-pay | Admitting: *Deleted

## 2021-07-07 NOTE — Telephone Encounter (Signed)
Left another message for patient to notify them that it is time to schedule annual low dose lung cancer screening CT scan. Instructed patient to call back [phone]) to verify information and schedule.

## 2021-07-14 ENCOUNTER — Other Ambulatory Visit: Payer: Self-pay | Admitting: *Deleted

## 2021-07-14 ENCOUNTER — Other Ambulatory Visit: Payer: Self-pay | Admitting: Acute Care

## 2021-07-14 ENCOUNTER — Ambulatory Visit
Admission: RE | Admit: 2021-07-14 | Discharge: 2021-07-14 | Disposition: A | Discharge status: Home / Self Care | Payer: Self-pay | Source: Ambulatory Visit | Attending: Acute Care | Admitting: Acute Care

## 2021-07-14 ENCOUNTER — Other Ambulatory Visit: Payer: Self-pay

## 2021-07-14 DIAGNOSIS — Z87891 Personal history of nicotine dependence: Secondary | ICD-10-CM

## 2021-07-18 ENCOUNTER — Ambulatory Visit
Admission: RE | Admit: 2021-07-18 | Discharge: 2021-07-18 | Disposition: A | Discharge status: Home / Self Care | Payer: Self-pay | Source: Ambulatory Visit | Attending: Acute Care | Admitting: Acute Care

## 2021-07-18 ENCOUNTER — Other Ambulatory Visit: Payer: Self-pay

## 2021-07-18 DIAGNOSIS — Z87891 Personal history of nicotine dependence: Secondary | ICD-10-CM | POA: Insufficient documentation

## 2021-07-28 ENCOUNTER — Telehealth: Payer: Self-pay | Admitting: Acute Care

## 2021-07-28 ENCOUNTER — Encounter: Payer: Self-pay | Admitting: *Deleted

## 2021-07-28 DIAGNOSIS — F17210 Nicotine dependence, cigarettes, uncomplicated: Secondary | ICD-10-CM

## 2021-07-28 NOTE — Telephone Encounter (Signed)
CT result letter sent to pt via Mychart. Copy of CT faxed to PCP. Order placed for 1 yr f/u low dose ct.   

## 2021-07-29 ENCOUNTER — Encounter: Payer: Self-pay | Admitting: Pulmonary Disease

## 2021-07-29 ENCOUNTER — Other Ambulatory Visit: Payer: Self-pay

## 2021-07-29 ENCOUNTER — Ambulatory Visit (INDEPENDENT_AMBULATORY_CARE_PROVIDER_SITE_OTHER): Payer: Self-pay | Admitting: Pulmonary Disease

## 2021-07-29 VITALS — BP 116/70 | HR 97 | Temp 97.8°F | Ht 62.0 in | Wt 140.8 lb

## 2021-07-29 DIAGNOSIS — J449 Chronic obstructive pulmonary disease, unspecified: Secondary | ICD-10-CM

## 2021-07-29 DIAGNOSIS — F17210 Nicotine dependence, cigarettes, uncomplicated: Secondary | ICD-10-CM

## 2021-07-29 DIAGNOSIS — J439 Emphysema, unspecified: Secondary | ICD-10-CM

## 2021-07-29 DIAGNOSIS — G4736 Sleep related hypoventilation in conditions classified elsewhere: Secondary | ICD-10-CM

## 2021-07-29 NOTE — Progress Notes (Signed)
Subjective:    Patient ID: Lisa Avila, female    DOB: 11-19-1956, 65 y.o.   MRN: [id number] Chief Complaint  Patient presents with   Follow-up    Sob with exertion, dry cough and wheezing.      HPI Is a 65 year old current smoker (1.5 PPD who presents for follow-up on the issue of severe COPD overlap and nocturnal hypoxemia.  The patient was last seen here on 01 April 2021.  This is a scheduled visit.  Been compliant with nocturnal oxygen and notes that this continues to help her symptoms during the day.  She has not obtain PFTs because of cost she is due for PFTs to compare to 2020 and determine severity of disease.  She is enrolled in low-dose lung cancer screening and had her low-dose scan on 22 July which showed multiple small nodules that are benign in appearance and stable.  She has centrilobular emphysema.    She has not had any fevers, chills or sweats.  No weight loss or anorexia.  No purulent sputum production.  No hemoptysis.  No orthopnea, paroxysmal nocturnal dyspnea, chest pain, leg swelling.  She does not endorse any calf pain or swelling.   DATA 12/05/2019 PFT: FEV1 0.65 L or 28% predicted, FVC 1.58 L or 52% predicted, FEV1/FVC 41%. Postbronchodilator FEV1 with 20% no change and FVC with 14% no change indicating a degree of airway obstruction reversibility.  There is air trapping noted.  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 12/25/2019 2D echo: LVEF 60 to 65%.  Borderline LVH.  Abnormalities.  Mildly elevated pulmonary artery systolic pressure.  RV normal. 05/22/2020 low-dose chest CT: Multiple small lung nodules, benign in appearance, diffuse bronchial wall thickening and centrilobular and paraseptal emphysema 07/18/2021 low-dose chest CT: Multiple small lung nodules that are benign in appearance again noted.  Some reduced in size others stable.  Centrilobular emphysema  again noted.  Follow-up in a year.  Review of Systems A 10 point review of systems was performed and it is as noted above otherwise negative.  Patient Active Problem List   Diagnosis Date Noted   COPD GOLD C 02/26/2014   Cough 02/26/2014   Tobacco abuse 02/26/2014   Social History   Tobacco Use   Smoking status: Every Day    Packs/day: 2.00    Years: 44.00    Pack years: 88.00    Types: Cigarettes   Smokeless tobacco: Never   Tobacco comments:    currently smoking 1.5 pack a day 07/29/2021  Substance Use Topics   Alcohol use: No    Alcohol/week: 0.0 standard drinks   No Known Allergies Current Meds  Medication Sig   albuterol (PROVENTIL HFA;VENTOLIN HFA) 108 (90 BASE) MCG/ACT inhaler Inhale into the lungs every 6 (six) hours as needed for wheezing or shortness of breath.   cyclobenzaprine (FLEXERIL) 10 MG tablet Take 10 mg by mouth 3 (three) times daily as needed.   Fluticasone-Umeclidin-Vilant (TRELEGY ELLIPTA) 200-62.5-25 MCG/INH AEPB Inhale 200 mcg into the lungs daily.   Spacer/Aero-Holding Chambers (AEROCHAMBER MV) inhaler Use as instructed to aid with inhaled medications.   traMADol (ULTRAM) 50 MG tablet Take 1 tablet (50 mg total) by mouth every 6 (six) hours as needed.   [DISCONTINUED] montelukast (SINGULAIR) 10 MG tablet Take 10 mg by mouth daily.   [DISCONTINUED] sertraline (ZOLOFT) 50 MG tablet Take 25 mg by mouth daily.   Immunization History  Administered Date(s)  Administered   Influenza Split 10/29/2013, 11/13/2014   Influenza,inj,Quad PF,6+ Mos 10/03/2018, 10/15/2020   Influenza-Unspecified 10/09/2019   PFIZER(Purple Top)SARS-COV-2 Vaccination 07/13/2020, 08/03/2020   Pneumococcal Conjugate-13 11/13/2014   Tdap 08/11/2016       Objective:   Physical Exam BP 116/70 (BP Location: Left Arm, Cuff Size: Normal)   Pulse 97   Temp 97.8 F (36.6 C) (Temporal)   Ht '5\' 2"'$  (1.575 m)   Wt 140 lb 12.8 oz (63.9 kg)   SpO2 96%   BMI 25.75 kg/m  GENERAL:  Well-developed well-nourished woman, no acute distress.  She is fully ambulatory.  No conversational dyspnea.   HEAD: Normocephalic, atraumatic. EYES: Pupils equal, round, reactive to light.  No scleral icterus. MOUTH: Nose/mouth/throat not examined due to masking requirements for COVID 19. NECK: Supple. No thyromegaly. Trachea midline. No JVD.  No adenopathy. PULMONARY: Good air entry bilaterally.  Coarse breath sounds otherwise , no adventitious sounds. CARDIOVASCULAR: S1 and S2. Regular rate and rhythm.  No rubs, murmurs or gallops heard. ABDOMEN: Benign. MUSCULOSKELETAL: Kyphosis noted, no clubbing, no edema. NEUROLOGIC: No focal deficit, no gait disturbance, speech is fluent. SKIN: Intact,warm,dry. PSYCH: Mood and behavior normal.   Ambulatory oximetry performed today: No desaturations beyond 92%.    Assessment & Plan:     ICD-10-CM   1. Stage 4 very severe COPD by GOLD classification ([place])  J44.9 Pulmonary Function Test [facility] Only    CANCELED: Pulmonary Function Test [facility] Only   Needs reassessment with PFTs Will obtain spirometry pre and post Ambulatory oximetry today did not show significant desaturations     2. Nocturnal hypoxemia due to emphysema (HCC)  J43.9    G47.36    Continue supplemental oxygen Notes improvement of symptoms with oxygen nocturnally    3. Tobacco dependence due to cigarettes  F17.210    Patient counseled regards to discontinuation of smoking Total counseling time 3 to 5 minutes     Orders Placed This Encounter  Procedures   Pulmonary Function Test [facility] Only    Standing Status:   Future    Standing Expiration Date:   07/29/2022    Scheduling Instructions:     Arlyce Harman pre and post only   Patient has very severe COPD.  Appears to be fairly well compensated at present.  She is on Trelegy Ellipta.  She unfortunately continues to smoke and has actually increased the amount of cigarette use.  She has been counseled regards to discontinuation of smoking.   Will obtain spirometry pre and postbronchodilator to reassess the severity of her disease.  Limited to spirometry due to patient unable to afford full PFT.  This will at least give [place] FEV1 data pre and post currently she is not employed and states that she does not intend to go back to work.  She is limited by severe breathlessness.  I have recommended that she consider applying for disability, I also made her aware that we do not grant disability this has to be done through the disability office.   We will see her in follow-up in 3 months time she is to contact us prior to that time should any new difficulties arise.   Renold Don, MD [facility]    *This note was dictated using voice recognition software/Dragon.  Despite best efforts to proofread, errors can occur which can change the meaning.  Any change was purely unintentional.

## 2021-07-29 NOTE — Patient Instructions (Signed)
Your oxygen level today while you walk was good at 92%.  We will schedule an abbreviated form of the breathing test to see where your lung function is at.  We will see you in follow-up in 3 months time call sooner should any new problems arise.  We continue to encourage you to quit smoking.

## 2021-07-29 NOTE — Progress Notes (Signed)
Please call patient and let them  know their  low dose Ct was read as a Lung RADS 2: nodules that are benign in appearance and behavior with a very low likelihood of becoming a clinically active cancer due to size or lack of growth. Recommendation per radiology is for a repeat LDCT in 12 months. .Please let them  know we will order and schedule their  annual screening scan for 06/2022. Please let them  know there was notation of CAD on their  scan.  Please remind the patient  that this is a non-gated exam therefore degree or severity of disease  cannot be determined. Please have them  follow up with their PCP regarding potential risk factor modification, dietary therapy or pharmacologic therapy if clinically indicated. Pt.  is not  currently on statin therapy. Please place order for annual  screening scan for  06/2022 and fax results to PCP. Thanks so much. CAD noted on scan, please have them follow up with PCP. Thanks so much

## 2021-08-13 ENCOUNTER — Telehealth: Payer: Self-pay | Admitting: Pulmonary Disease

## 2021-08-13 NOTE — Telephone Encounter (Signed)
Rec'd forms- prepared forms and emailed to [facility] in [place] to have Dr. Patsey Berthold complete and sign forms-pr

## 2021-08-15 NOTE — Telephone Encounter (Signed)
Forms have been placed in Dr. Domingo Dimes folder for signature.

## 2021-08-20 NOTE — Telephone Encounter (Signed)
FMLA form and last OV note has been faxed to [place].  Nothing further needed at this time.

## 2021-08-26 IMAGING — CT CT CHEST LUNG CANCER SCREENING LOW DOSE W/O CM
2 of 5 series · 15 of 40 positions shown, 18 images · non-contrast
Comparison: Low-dose lung cancer screening chest CT 02/20/2019.

CLINICAL DATA: 63-year-old female current smoker with 67 pack-year
history of smoking. Lung cancer screening.

EXAM:
CT CHEST WITHOUT CONTRAST LOW-DOSE FOR LUNG CANCER SCREENING
TECHNIQUE: Multidetector CT imaging of the chest was performed following the
standard protocol without IV contrast.

[Series 3: lung 1.00 · axial · 0.58mm/px · z∈[-1530,-1235]mm · 12 of 329 slices shown, 15 images]
[im 17/329  mediastinal]
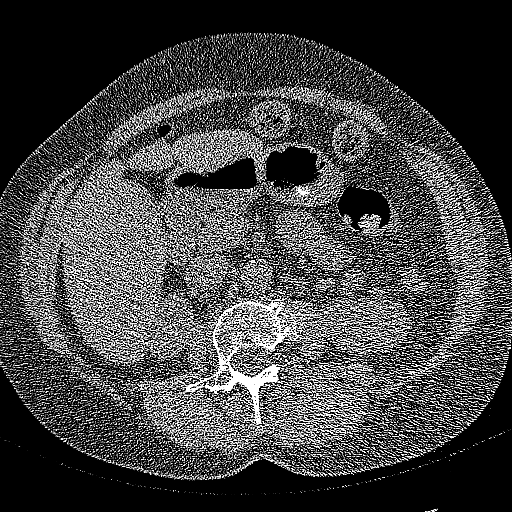
[im 17/329  lung]
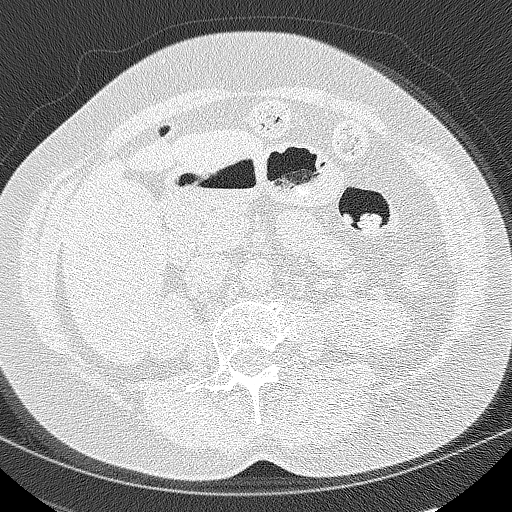
[im 50/329  lung]
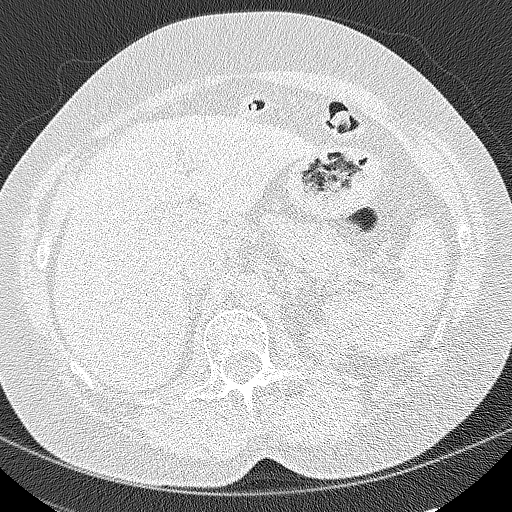
[im 66/329  lung]
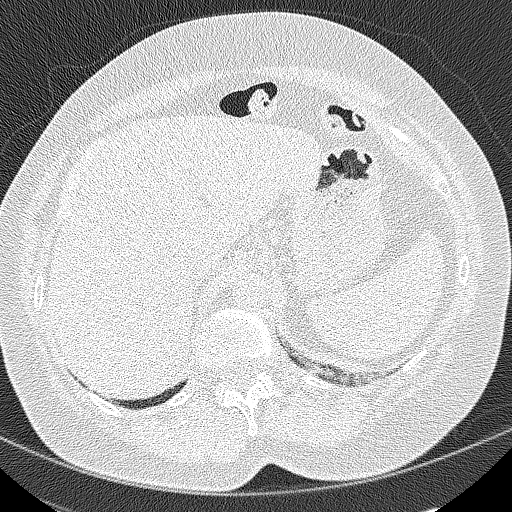
[im 99/329  lung]
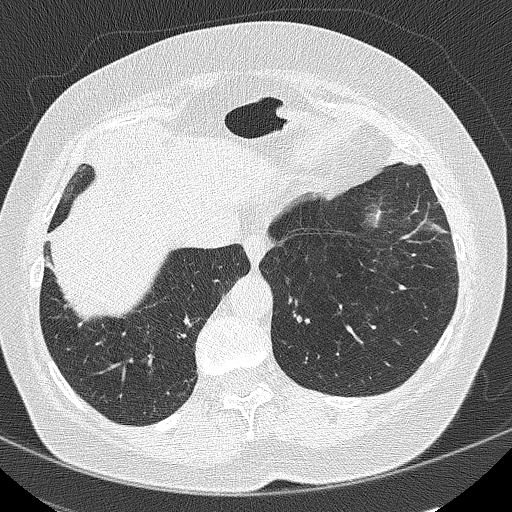
[im 132/329  mediastinal]
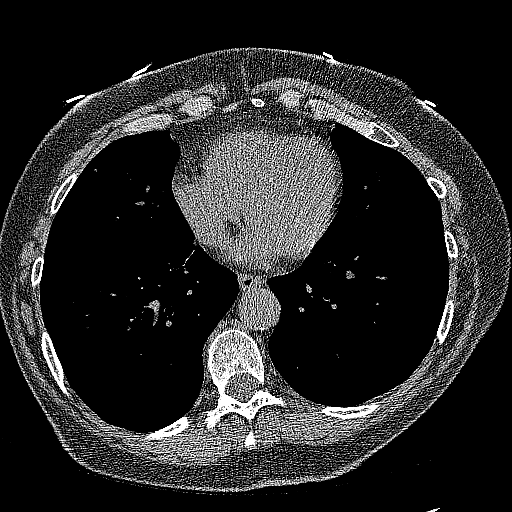
[im 132/329  lung]
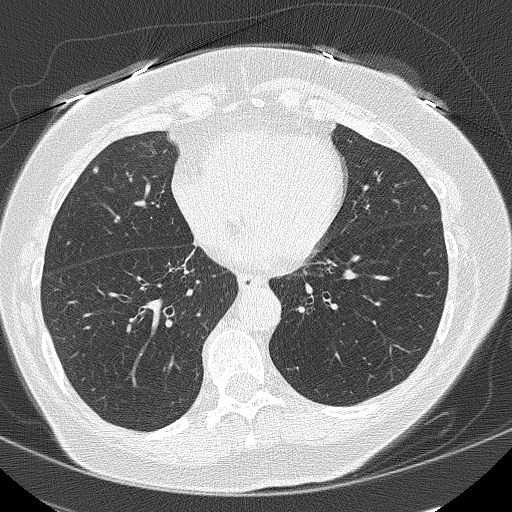
[im 148/329  lung]
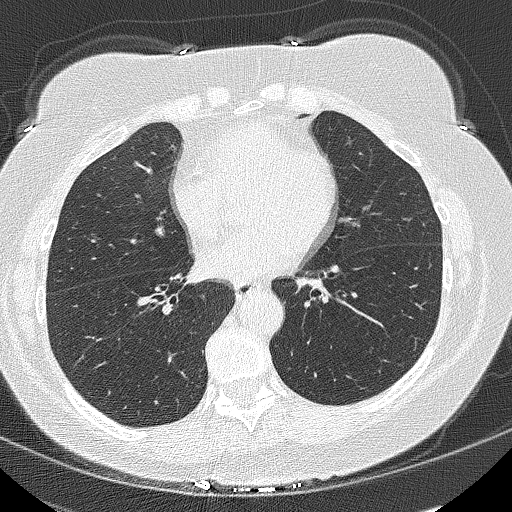
[im 181/329  lung]
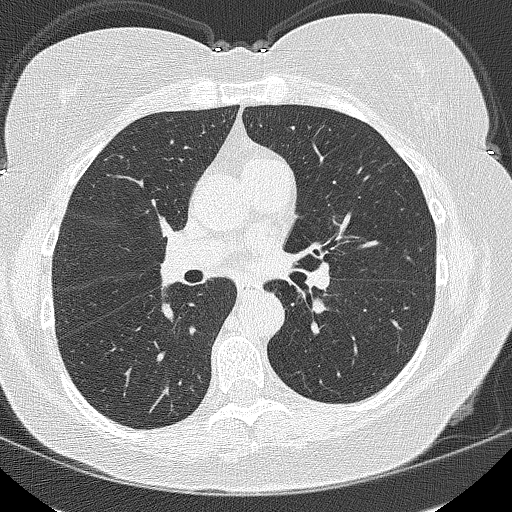
[im 197/329  lung]
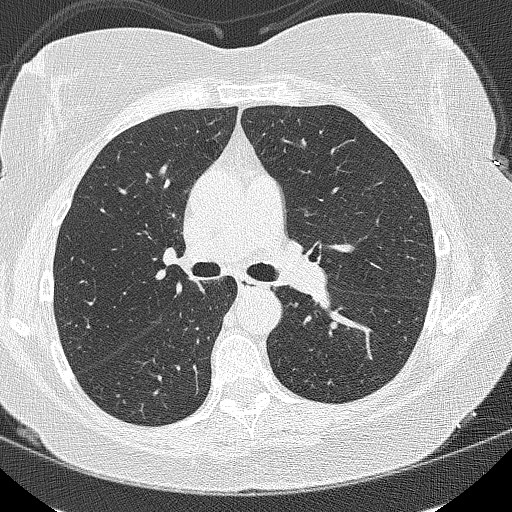
[im 230/329  mediastinal]
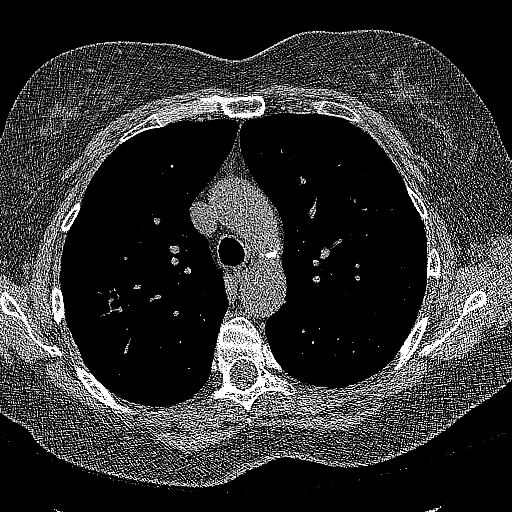
[im 230/329  lung]
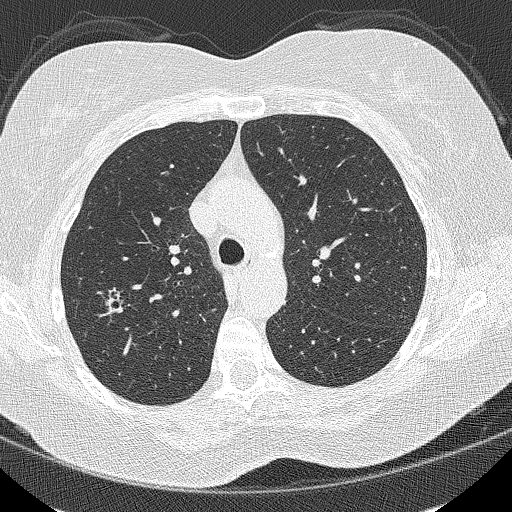
[im 263/329  lung]
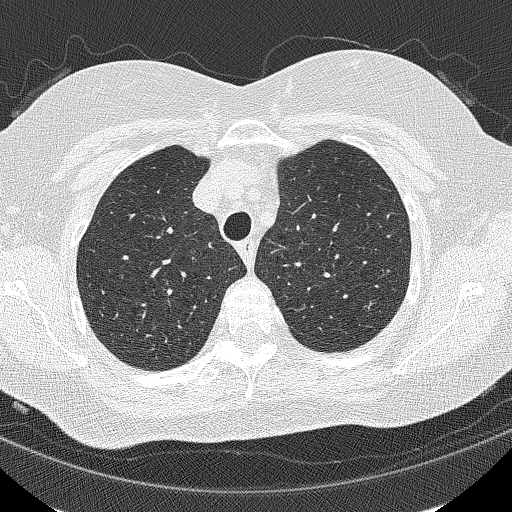
[im 279/329  lung]
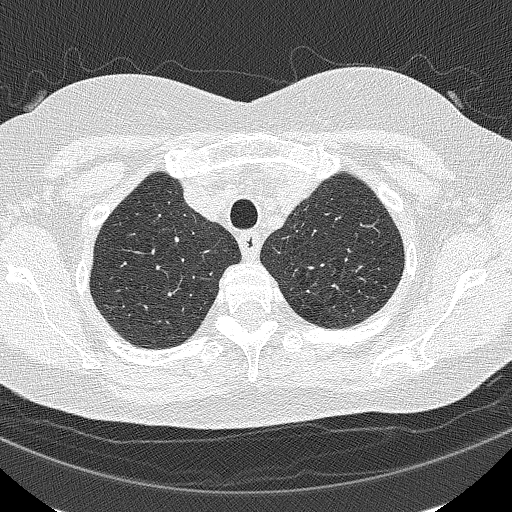
[im 312/329  lung]
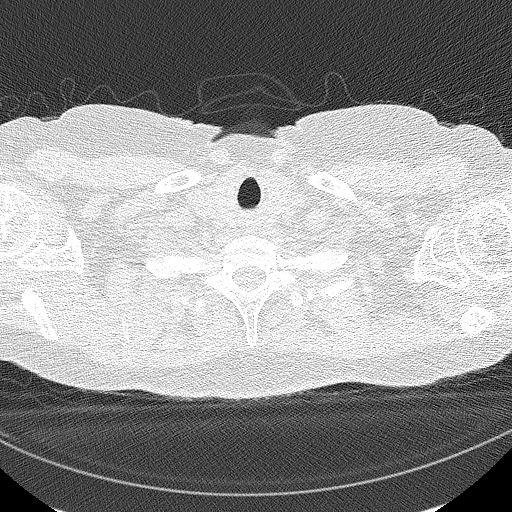

[Series 4: coronals lung 1.00 cor · coronal · 0.56mm/px · 3 of 276 slices shown]
[im 56/276  lung]
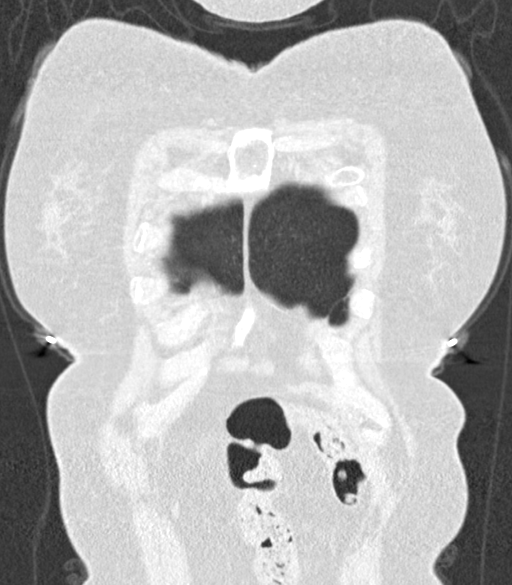
[im 111/276  lung]
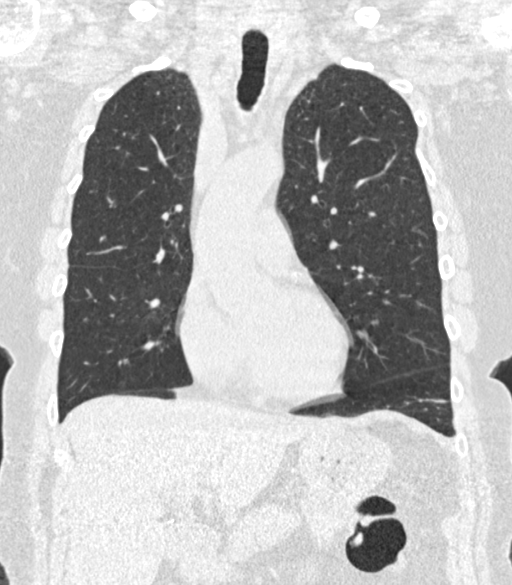
[im 166/276  lung]
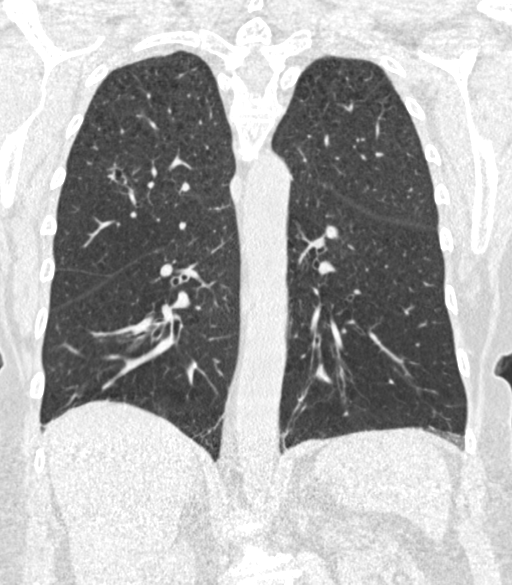

[15 of 40 positions shown; findings below may reference images not displayed]

FINDINGS: Cardiovascular: Heart size is normal. There is no significant
pericardial fluid, thickening or pericardial calcification. There is
aortic atherosclerosis, as well as atherosclerosis of the great
vessels of the mediastinum and the coronary arteries, including
calcified atherosclerotic plaque in the left anterior descending and
right coronary arteries.

Mediastinum/Nodes: No pathologically enlarged mediastinal or hilar
lymph nodes. Please note that accurate exclusion of hilar adenopathy
is limited on noncontrast CT scans. Esophagus is unremarkable in
appearance. No axillary lymphadenopathy.

Lungs/Pleura: Multiple pulmonary nodules are again noted throughout
the lungs. Importantly, there is a new mixed solid and sub solid
lesion in the right upper lobe which has internal cystic components,
with a volume derived mean diameter of 12.5 mm, with internal solid
components measuring up to 7.1 mm (axial image 100 of series 3). No
larger more suspicious appearing pulmonary nodules or masses are
noted. No acute consolidative airspace disease. No pleural
effusions. Mild diffuse bronchial wall thickening with mild
centrilobular and paraseptal emphysema.

Upper Abdomen: Aortic atherosclerosis.  Status post cholecystectomy.

Musculoskeletal: There are no aggressive appearing lytic or blastic
lesions noted in the visualized portions of the skeleton.
IMPRESSION: 1. Lung-RADS 4BS, suspicious. Additional imaging evaluation or
consultation with Pulmonology or Thoracic Surgery recommended.
2. The "S" modifier above refers to potentially clinically
significant non lung cancer related findings. Specifically, there is
aortic atherosclerosis, in addition to 2 vessel coronary artery
disease. Please note that although the presence of coronary artery
calcium documents the presence of coronary artery disease, the
severity of this disease and any potential stenosis cannot be
assessed on this non-gated CT examination. Assessment for potential
risk factor modification, dietary therapy or pharmacologic therapy
may be warranted, if clinically indicated.
3. Mild diffuse bronchial wall thickening with mild centrilobular
and paraseptal emphysema; imaging findings suggestive of underlying
COPD.

Aortic Atherosclerosis ([id number]-[id number].P) and Emphysema ([id number]-[id number]).

## 2021-08-27 NOTE — Telephone Encounter (Signed)
I have spoke with Lisa Avila and let her know that since she was seen 07/29/21 with rov 3 months that her PFT wouldn't need to be done until October.  After talking with her we now have her PFT scheduled on 09/25/21 @ 11:00am and Covid Test Scheduled 09/24/21 @ 11:00am

## 2021-08-27 NOTE — Telephone Encounter (Signed)
Anita, please advise. Thanks 

## 2021-09-19 ENCOUNTER — Telehealth: Payer: Self-pay

## 2021-09-19 NOTE — Telephone Encounter (Signed)
Called and spoke to patient about upcoming Covid test, patient had a clear understanding nothing further needed.

## 2021-09-24 ENCOUNTER — Other Ambulatory Visit
Admission: RE | Admit: 2021-09-24 | Discharge: 2021-09-24 | Disposition: A | Discharge status: Home / Self Care | Payer: Medicaid Other | Source: Ambulatory Visit | Attending: Pulmonary Disease | Admitting: Pulmonary Disease

## 2021-09-24 ENCOUNTER — Other Ambulatory Visit: Payer: Self-pay

## 2021-09-24 DIAGNOSIS — Z01812 Encounter for preprocedural laboratory examination: Secondary | ICD-10-CM | POA: Diagnosis present

## 2021-09-24 DIAGNOSIS — Z20822 Contact with and (suspected) exposure to covid-19: Secondary | ICD-10-CM | POA: Diagnosis not present

## 2021-09-24 LAB — SARS CORONAVIRUS 2 (TAT 6-24 HRS): SARS Coronavirus 2: NEGATIVE

## 2021-09-25 ENCOUNTER — Ambulatory Visit: Payer: Medicaid Other | Attending: Pulmonary Disease

## 2021-09-25 DIAGNOSIS — F17210 Nicotine dependence, cigarettes, uncomplicated: Secondary | ICD-10-CM | POA: Insufficient documentation

## 2021-09-25 DIAGNOSIS — J449 Chronic obstructive pulmonary disease, unspecified: Secondary | ICD-10-CM | POA: Insufficient documentation

## 2021-09-26 ENCOUNTER — Telehealth: Payer: Self-pay | Admitting: Pulmonary Disease

## 2021-10-02 NOTE — Telephone Encounter (Signed)
LMTCB.   Will close encounter. No recent phone calls noted in chart.

## 2021-11-10 ENCOUNTER — Telehealth: Payer: Self-pay | Admitting: Pulmonary Disease

## 2021-11-10 NOTE — Telephone Encounter (Signed)
Margie have you seen these PT assistance forms?  thanks

## 2021-11-11 ENCOUNTER — Other Ambulatory Visit: Payer: Self-pay

## 2021-11-11 MED ORDER — TRELEGY ELLIPTA 100-62.5-25 MCG/ACT IN AEPB: 1.0000 | INHALATION_SPRAY | Freq: Every day | RESPIRATORY_TRACT | 11 refills | Status: DC

## 2021-11-11 NOTE — Telephone Encounter (Addendum)
Patient dropped off patient assistance forms for [place].  Forms and Rx for Trelegy have been placed in Dr. Domingo Dimes folder for signature. Also called and left message for patient letting her know we are taking care of it.

## 2021-11-14 NOTE — Telephone Encounter (Signed)
Informed patient that information has been faxed to [place].

## 2021-11-14 NOTE — Telephone Encounter (Signed)
Forms and Rx signed and faxed to [facility].  Lm for patient.

## 2021-11-14 NOTE — Telephone Encounter (Signed)
Noted.  Will close encounter.  

## 2022-05-14 DIAGNOSIS — J449 Chronic obstructive pulmonary disease, unspecified: Secondary | ICD-10-CM | POA: Diagnosis not present

## 2022-05-22 ENCOUNTER — Telehealth: Payer: Self-pay | Admitting: Pulmonary Disease

## 2022-05-22 NOTE — Telephone Encounter (Signed)
Patient is aware of recommendations and voiced her understanding.  Pending appt 08/06/2022 at 10:30. Nothing further needed.

## 2022-05-22 NOTE — Telephone Encounter (Signed)
Spoke to patient.  She stated that she seen PCP last week and was started on amoxicillin. Neg for covid.  She completed abx yesterday. She had mild relief in sx with abx. C/O prod cough with clear to light green sputum, chest congestion, wheezing and increased SOB with exertion. Using trelegy once daily, albuterol HFA QID and albuterol solution BID.  2.5L QHS. Spo2 maintaining around 96% during the day.  Dr. Patsey Berthold, please advise. Thanks

## 2022-05-22 NOTE — Telephone Encounter (Signed)
We have not seen her since August 2022 was supposed to follow-up in 3 months time after that.  I am concerned that she is still very short of breath despite the antibiotics that she was given.  Recommend that she come to the emergency room for evaluation as she may need chest x-rays and other studies to make sure it does not RSV or a similar virus.

## 2022-05-26 ENCOUNTER — Telehealth: Payer: Self-pay | Admitting: *Deleted

## 2022-05-26 DIAGNOSIS — J449 Chronic obstructive pulmonary disease, unspecified: Secondary | ICD-10-CM

## 2022-05-26 NOTE — Telephone Encounter (Signed)
Ross with [facility] called  to request an order for a portable oxygen tank for patient.  Please order to [phone].

## 2022-05-26 NOTE — Telephone Encounter (Signed)
According to our records, patient did not qualify for POC during last OV. She is prescribed nocturnal oxygen only.  Spoke to [place] with [facility] and relayed above message. She is aware that qualifying walk would be needed for POC.  Charlett Nose stated that provider would need to state night time oxygen only on order.  According to our records, patient is currently with [facility].   Spoke to patient. She stated that she now has medicare. She stated that she received a call from [facility] stating that she needs to switch. She would like to stay with [facility] if possible.  Rodena Piety, can you help with this?

## 2022-05-27 DIAGNOSIS — J440 Chronic obstructive pulmonary disease with acute lower respiratory infection: Secondary | ICD-10-CM | POA: Diagnosis not present

## 2022-05-27 DIAGNOSIS — Z6824 Body mass index (BMI) 24.0-24.9, adult: Secondary | ICD-10-CM | POA: Diagnosis not present

## 2022-05-27 NOTE — Telephone Encounter (Signed)
Order placed to [facility].  Lm to make patient aware.

## 2022-05-27 NOTE — Telephone Encounter (Signed)
Patient now has [employer] and they use [facility] for DME.  [facility] needs a new  02 order faxed to them to transition the patients 02 from [place] to [facility]. If a new order can be placed I will fax to the number that Shanon Brow from [facility] gave me Fax  # [phone]

## 2022-05-28 NOTE — Telephone Encounter (Signed)
Patient is aware of below message and voiced her understanding.  Nothing further needed.   

## 2022-06-14 DIAGNOSIS — J449 Chronic obstructive pulmonary disease, unspecified: Secondary | ICD-10-CM | POA: Diagnosis not present

## 2022-07-14 DIAGNOSIS — J449 Chronic obstructive pulmonary disease, unspecified: Secondary | ICD-10-CM | POA: Diagnosis not present

## 2022-07-20 ENCOUNTER — Ambulatory Visit
Admission: RE | Admit: 2022-07-20 | Discharge: 2022-07-20 | Disposition: A | Discharge status: Home / Self Care | Payer: No Typology Code available for payment source | Source: Ambulatory Visit | Attending: Family Medicine | Admitting: Family Medicine

## 2022-07-20 DIAGNOSIS — J439 Emphysema, unspecified: Secondary | ICD-10-CM | POA: Diagnosis not present

## 2022-07-20 DIAGNOSIS — I700 Atherosclerosis of aorta: Secondary | ICD-10-CM | POA: Diagnosis not present

## 2022-07-20 DIAGNOSIS — F17210 Nicotine dependence, cigarettes, uncomplicated: Secondary | ICD-10-CM | POA: Diagnosis not present

## 2022-07-20 DIAGNOSIS — Z122 Encounter for screening for malignant neoplasm of respiratory organs: Secondary | ICD-10-CM | POA: Insufficient documentation

## 2022-07-20 DIAGNOSIS — I2510 Atherosclerotic heart disease of native coronary artery without angina pectoris: Secondary | ICD-10-CM | POA: Insufficient documentation

## 2022-07-22 ENCOUNTER — Other Ambulatory Visit: Payer: Self-pay

## 2022-07-22 DIAGNOSIS — Z87891 Personal history of nicotine dependence: Secondary | ICD-10-CM

## 2022-07-22 DIAGNOSIS — F17210 Nicotine dependence, cigarettes, uncomplicated: Secondary | ICD-10-CM

## 2022-07-22 DIAGNOSIS — Z122 Encounter for screening for malignant neoplasm of respiratory organs: Secondary | ICD-10-CM

## 2022-08-06 ENCOUNTER — Ambulatory Visit: Payer: Medicaid Other | Admitting: Pulmonary Disease

## 2022-08-12 DIAGNOSIS — G894 Chronic pain syndrome: Secondary | ICD-10-CM | POA: Diagnosis not present

## 2022-08-12 DIAGNOSIS — M542 Cervicalgia: Secondary | ICD-10-CM | POA: Diagnosis not present

## 2022-08-12 DIAGNOSIS — F321 Major depressive disorder, single episode, moderate: Secondary | ICD-10-CM | POA: Diagnosis not present

## 2022-08-12 DIAGNOSIS — M549 Dorsalgia, unspecified: Secondary | ICD-10-CM | POA: Diagnosis not present

## 2022-08-12 DIAGNOSIS — Z79891 Long term (current) use of opiate analgesic: Secondary | ICD-10-CM | POA: Diagnosis not present

## 2022-08-12 DIAGNOSIS — Z6824 Body mass index (BMI) 24.0-24.9, adult: Secondary | ICD-10-CM | POA: Diagnosis not present

## 2022-08-14 DIAGNOSIS — J449 Chronic obstructive pulmonary disease, unspecified: Secondary | ICD-10-CM | POA: Diagnosis not present

## 2022-09-14 DIAGNOSIS — J449 Chronic obstructive pulmonary disease, unspecified: Secondary | ICD-10-CM | POA: Diagnosis not present

## 2022-10-08 ENCOUNTER — Encounter: Payer: Self-pay | Admitting: Pulmonary Disease

## 2022-10-08 ENCOUNTER — Ambulatory Visit (INDEPENDENT_AMBULATORY_CARE_PROVIDER_SITE_OTHER): Payer: No Typology Code available for payment source | Admitting: Pulmonary Disease

## 2022-10-08 VITALS — BP 118/80 | HR 80 | Temp 97.9°F | Ht 62.0 in | Wt 140.0 lb

## 2022-10-08 DIAGNOSIS — Z23 Encounter for immunization: Secondary | ICD-10-CM

## 2022-10-08 DIAGNOSIS — J449 Chronic obstructive pulmonary disease, unspecified: Secondary | ICD-10-CM

## 2022-10-08 DIAGNOSIS — F17210 Nicotine dependence, cigarettes, uncomplicated: Secondary | ICD-10-CM | POA: Diagnosis not present

## 2022-10-08 DIAGNOSIS — J439 Emphysema, unspecified: Secondary | ICD-10-CM

## 2022-10-08 DIAGNOSIS — G4736 Sleep related hypoventilation in conditions classified elsewhere: Secondary | ICD-10-CM

## 2022-10-08 NOTE — Patient Instructions (Signed)
You did well with your walking test today.  If you notice that you cannot "catch up" to others while walking and feel shortness of breath do not hesitate to use your rescue inhaler.  You received your flu vaccine today.  We will see you in follow-up in 6 months time we will get a repeat breathing test prior to that visit.  This will allow [place] to monitor your lung status.

## 2022-10-08 NOTE — Progress Notes (Signed)
Subjective:    Patient ID: Lisa Avila, female    DOB: 04-Jun-1956, 66 y.o.   MRN: [id number] Patient Care Team: Dortha Kern, MD as PCP - General (Family Medicine) Salena Saner, MD as Consulting Physician (Pulmonary Disease)  Chief Complaint  Patient presents with   Follow-up    COPD.    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  The patient was last seen here on 29 July 2021.  This is a scheduled visit.  She has been compliant with nocturnal oxygen and notes that this continues to help her symptoms during the day.  She has not followed through with getting PFTs done due to co-pay costs.  She is enrolled in low-dose lung cancer screening and had her low-dose scan on 20 July 2022 which showed multiple small nodules that are benign in appearance and stable.  She has significant centrilobular emphysema.   She has not had any fevers, chills or sweats.  No weight loss or anorexia.  No purulent sputum production.  No hemoptysis. No orthopnea, paroxysmal nocturnal dyspnea, chest pain, leg swelling.  She does not endorse any calf pain.  Dyspnea on exertion is at baseline notes that Trelegy helps.    Does note that when she walks with others she seems not to be able to "catch up" to them.  This is due to dyspnea on exertion.  She requests flu vaccine today.    DATA 12/05/2019 PFT: FEV1 0.65 L or 28% predicted, FVC 1.58 L or 52% predicted, FEV1/FVC 41%. Postbronchodilator FEV1 with 20% no change and FVC with 14% no change indicating a degree of airway obstruction reversibility.  There is air trapping noted.  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 12/25/2019 2D echo: LVEF 60 to 65%.  Borderline LVH.  Abnormalities.  Mildly elevated pulmonary artery systolic pressure.  RV  normal. 05/22/2020 low-dose chest CT: Multiple small lung nodules, benign in appearance, diffuse bronchial wall thickening and centrilobular and paraseptal emphysema 07/18/2021 low-dose chest CT: Multiple small lung nodules that are benign in appearance again noted.  Some reduced in size others stable.  Centrilobular emphysema again noted.  Follow-up in a year. 09/25/2021 PFTs: FEV1 0.71 L or 31% predicted, FVC 1.64 L or 55% predicted, FEV1/FVC 43%, lung volumes showed air trapping, no significant bronchodilator response.  Diffusion capacity severely reduced. 07/20/2022 LDCT: Lung RADS 2, moderate centrilobular emphysema, isolated right middle lobe pulmonary nodule 3 mm in size, mixed attenuation nodule on the prior exam has resolved.  Review of Systems A 10 point review of systems was performed and it is as noted above otherwise negative.  Patient Active Problem List   Diagnosis Date Noted   COPD GOLD C 02/26/2014   Cough 02/26/2014   Tobacco abuse 02/26/2014   Social History   Tobacco Use   Smoking status: Every Day    Packs/day: 2.00    Years: 44.00    Total pack years: 88.00    Types: Cigarettes   Smokeless tobacco: Never   Tobacco comments:    currently smoking 1.5 pack a day 07/29/2021    1.5 PPD 10/08/2022  Substance Use Topics   Alcohol use: No    Alcohol/week: 0.0 standard drinks of alcohol   No Known Allergies  Current Meds  Medication Sig   albuterol (PROVENTIL HFA;VENTOLIN HFA)  108 (90 BASE) MCG/ACT inhaler Inhale into the lungs every 6 (six) hours as needed for wheezing or shortness of breath.   cyclobenzaprine (FLEXERIL) 10 MG tablet Take 10 mg by mouth 3 (three) times daily as needed.   rOPINIRole (REQUIP) 0.5 MG tablet Take 0.5 tablets by mouth as needed.   sertraline (ZOLOFT) 50 MG tablet Take 50 mg by mouth daily.   Spacer/Aero-Holding Chambers (AEROCHAMBER MV) inhaler Use as instructed to aid with inhaled medications.   traMADol (ULTRAM) 50 MG tablet Take 1  tablet (50 mg total) by mouth every 6 (six) hours as needed.   TRELEGY ELLIPTA 200-62.5-25 MCG/ACT AEPB Inhale 1 puff into the lungs daily.   Immunization History  Administered Date(s) Administered   Influenza Split 10/29/2013, 11/13/2014   Influenza,inj,Quad PF,6+ Mos 10/03/2018, 10/15/2020   Influenza-Unspecified 10/09/2019   PFIZER(Purple Top)SARS-COV-2 Vaccination 07/13/2020, 08/03/2020   Pneumococcal Conjugate-13 11/13/2014   Tdap 08/11/2016       Objective:   Physical Exam BP 118/80 (BP Location: Left Arm, Cuff Size: Normal)   Pulse 80   Temp 97.9 F (36.6 C)   Ht  (1.575 m)   Wt 140 lb (63.5 kg)   SpO2 98%   BMI 25.61 kg/m  GENERAL: Well-developed well-nourished woman, no acute distress.  She is fully ambulatory.  No conversational dyspnea.   HEAD: Normocephalic, atraumatic. EYES: Pupils equal, round, reactive to light.  No scleral icterus. MOUTH: Dentures, upper or lower no thrush.  Oral mucosa moist. NECK: Supple. No thyromegaly. Trachea midline. No JVD.  No adenopathy. PULMONARY: Good air entry bilaterally.  Coarse breath sounds otherwise, no adventitious sounds. CARDIOVASCULAR: S1 and S2. Regular rate and rhythm.  No rubs, murmurs or gallops heard. ABDOMEN: Benign. MUSCULOSKELETAL: Kyphosis noted, no clubbing, no edema. NEUROLOGIC: No focal deficit, no gait disturbance, speech is fluent. SKIN: Intact,warm,dry. PSYCH: Mood and behavior normal.  Ambulatory oximetry: Performed today, at baseline on room air oxygen saturation 97% nadir was 92% on third lap.  Heart rate 85 bpm at rest 102 bpm at maximum exercise.  Patient was able to ambulate 750 feet, mild shortness of breath noted.     Assessment & Plan:     ICD-10-CM   1. Stage 3 severe COPD by GOLD classification (HCC)  J44.9 Pulmonary Function Test [facility] Only   Continue Trelegy Continue as needed albuterol PFT prior to follow-up    2. Nocturnal hypoxemia due to emphysema [facility])  J43.9    G47.36     Patient compliant with nocturnal oxygen supplementation Patient notes benefit of therapy New oxygen at 2 L/min at hs    3. Tobacco dependence due to cigarettes  F17.210    Counseled regards discontinuation of smoking Total counseling time 3 to 5 minutes    4. Need for immunization against influenza  Z23 Flu Vaccine QUAD 49mo+IM (Fluarix, Fluzone & Alfiuria Quad PF)   Received flu vaccine today.     Orders Placed This Encounter  Procedures   Flu Vaccine QUAD 52mo+IM (Fluarix, Fluzone & Alfiuria Quad PF)   Pulmonary Function Test [facility] Only    In 6 months    Standing Status:   Future    Standing Expiration Date:   10/09/2023    Order Specific Question:   Full PFT: includes the following: basic spirometry, spirometry pre & post bronchodilator, diffusion capacity (DLCO), lung volumes    Answer:   Full PFT    Order Specific Question:   This test can only be performed at  Answer:    [facility]    Will see the patient in follow-up in 2 months time she is to contact us prior to that time should any new difficulties arise.  Gailen Shelter, MD Advanced Bronchoscopy PCCM [facility] [place]    *This note was dictated using voice recognition software/Dragon.  Despite best efforts to proofread, errors can occur which can change the meaning. Any transcriptional errors that result from this process are unintentional and may not be fully corrected at the time of dictation.

## 2022-10-14 DIAGNOSIS — J449 Chronic obstructive pulmonary disease, unspecified: Secondary | ICD-10-CM | POA: Diagnosis not present

## 2022-11-14 DIAGNOSIS — J449 Chronic obstructive pulmonary disease, unspecified: Secondary | ICD-10-CM | POA: Diagnosis not present

## 2022-12-14 DIAGNOSIS — J449 Chronic obstructive pulmonary disease, unspecified: Secondary | ICD-10-CM | POA: Diagnosis not present

## 2023-04-20 ENCOUNTER — Encounter: Payer: Self-pay | Admitting: Pulmonary Disease

## 2023-05-10 ENCOUNTER — Encounter: Payer: Self-pay | Admitting: Internal Medicine

## 2023-05-10 ENCOUNTER — Ambulatory Visit (INDEPENDENT_AMBULATORY_CARE_PROVIDER_SITE_OTHER): Payer: No Typology Code available for payment source | Admitting: Internal Medicine

## 2023-05-10 VITALS — BP 120/78 | HR 88 | Ht 62.0 in | Wt 140.0 lb

## 2023-05-10 DIAGNOSIS — Z1211 Encounter for screening for malignant neoplasm of colon: Secondary | ICD-10-CM

## 2023-05-10 DIAGNOSIS — F324 Major depressive disorder, single episode, in partial remission: Secondary | ICD-10-CM | POA: Diagnosis not present

## 2023-05-10 DIAGNOSIS — Z1231 Encounter for screening mammogram for malignant neoplasm of breast: Secondary | ICD-10-CM

## 2023-05-10 DIAGNOSIS — G4736 Sleep related hypoventilation in conditions classified elsewhere: Secondary | ICD-10-CM | POA: Diagnosis not present

## 2023-05-10 DIAGNOSIS — J449 Chronic obstructive pulmonary disease, unspecified: Secondary | ICD-10-CM

## 2023-05-10 DIAGNOSIS — F17200 Nicotine dependence, unspecified, uncomplicated: Secondary | ICD-10-CM | POA: Diagnosis not present

## 2023-05-10 DIAGNOSIS — J439 Emphysema, unspecified: Secondary | ICD-10-CM | POA: Diagnosis not present

## 2023-05-10 DIAGNOSIS — G2581 Restless legs syndrome: Secondary | ICD-10-CM | POA: Insufficient documentation

## 2023-05-10 NOTE — Assessment & Plan Note (Addendum)
Followed by Pulmonary Severe COPD on nocturnal oxygen. Recommend continue Trelegy; resume nebulizer bid and use albuterol MDI as needed

## 2023-05-10 NOTE — Assessment & Plan Note (Signed)
Clinically stable on current regimen with good control of symptoms, No SI or HI. Long standing problem No change in management at this time with Zoloft.

## 2023-05-10 NOTE — Assessment & Plan Note (Signed)
Continues to smoke but participating in lung cancer screening

## 2023-05-10 NOTE — Progress Notes (Signed)
Date:  05/10/2023   Name:  Lisa Avila   DOB:  1956/04/02   MRN:  [id number]   Chief Complaint: New Patient (Initial Visit) and COPD (Cannot get in with pulmonary doctor until 07/18)  COPD She complains of chest tightness, shortness of breath, sputum production and wheezing. There is no cough or frequent throat clearing. This is a recurrent problem. Pertinent negatives include no appetite change, chest pain, headaches or trouble swallowing. Her symptoms are alleviated by beta-agonist, steroid inhaler and ipratropium (and nocturnal oxygen). Her past medical history is significant for COPD.  Depression        This is a chronic problem.The problem is unchanged.  Associated symptoms include no fatigue, no appetite change and no headaches.  Past treatments include SSRIs - Selective serotonin reuptake inhibitors.  Compliance with treatment is good.   No results found for: "NA", "K", "CO2", "GLUCOSE", "BUN", "CREATININE", "CALCIUM", "EGFR", "GFRNONAA" No results found for: "CHOL", "HDL", "LDLCALC", "LDLDIRECT", "TRIG", "CHOLHDL" No results found for: "TSH" No results found for: "HGBA1C" No results found for: "WBC", "HGB", "HCT", "MCV", "PLT" No results found for: "ALT", "AST", "GGT", "ALKPHOS", "BILITOT" No results found for: "25OHVITD2", "25OHVITD3", "VD25OH"   Review of Systems  Constitutional:  Negative for appetite change, diaphoresis and fatigue.  HENT:  Negative for trouble swallowing.   Respiratory:  Positive for sputum production, chest tightness, shortness of breath and wheezing. Negative for cough.   Cardiovascular:  Negative for chest pain and leg swelling.  Musculoskeletal:  Negative for arthralgias and gait problem.  Neurological:  Negative for dizziness, light-headedness and headaches.  Psychiatric/Behavioral:  Positive for depression. Negative for dysphoric mood and sleep disturbance. The patient is not nervous/anxious.     Patient Active Problem List   Diagnosis Date  Noted   Restless leg syndrome 05/10/2023   Major depression single episode, in partial remission (HCC) 05/10/2023   Nocturnal hypoxemia due to emphysema (HCC) 05/10/2023   COPD GOLD C 02/26/2014   Severe tobacco use disorder 02/26/2014    No Known Allergies  Past Surgical History:  Procedure Laterality Date   CHOLECYSTECTOMY  2010   TUBAL LIGATION  1983    Social History   Tobacco Use   Smoking status: Every Day    Packs/day: 1.50    Years: 50.00    Additional pack years: 0.00    Total pack years: 75.00    Types: Cigarettes   Smokeless tobacco: Never   Tobacco comments:    currently smoking 1.5 pack a day 07/29/2021    1.5 PPD 10/08/2022  Vaping Use   Vaping Use: Never used  Substance Use Topics   Alcohol use: No    Alcohol/week: 0.0 standard drinks of alcohol   Drug use: No     Medication list has been reviewed and updated.  Current Meds  Medication Sig   albuterol (PROVENTIL HFA;VENTOLIN HFA) 108 (90 BASE) MCG/ACT inhaler Inhale into the lungs every 6 (six) hours as needed for wheezing or shortness of breath.   cyclobenzaprine (FLEXERIL) 10 MG tablet Take 10 mg by mouth 3 (three) times daily as needed.   rOPINIRole (REQUIP) 0.5 MG tablet Take 0.5 tablets by mouth as needed.   sertraline (ZOLOFT) 50 MG tablet Take 50 mg by mouth daily.   Spacer/Aero-Holding Chambers (AEROCHAMBER MV) inhaler Use as instructed to aid with inhaled medications.   traMADol (ULTRAM) 50 MG tablet Take 1 tablet (50 mg total) by mouth every 6 (six) hours as needed.   TRELEGY ELLIPTA  200-62.5-25 MCG/ACT AEPB Inhale 1 puff into the lungs daily.       05/10/2023    2:23 PM  GAD 7 : Generalized Anxiety Score  Nervous, Anxious, on Edge 1  Control/stop worrying 1  Worry too much - different things 1  Trouble relaxing 0  Restless 0  Easily annoyed or irritable 0  Afraid - awful might happen 0  Total GAD 7 Score 3  Anxiety Difficulty Not difficult at all       05/10/2023    2:23 PM   Depression screen PHQ 2/9  Decreased Interest 0  Down, Depressed, Hopeless 0  PHQ - 2 Score 0  Altered sleeping 1  Tired, decreased energy 1  Change in appetite 1  Feeling bad or failure about yourself  0  Trouble concentrating 0  Moving slowly or fidgety/restless 0  Suicidal thoughts 0  PHQ-9 Score 3  Difficult doing work/chores Not difficult at all    BP Readings from Last 3 Encounters:  05/10/23 120/78  10/08/22 118/80  07/29/21 116/70    Physical Exam Vitals and nursing note reviewed.  Constitutional:      General: She is not in acute distress.    Appearance: Normal appearance. She is well-developed.  HENT:     Head: Normocephalic and atraumatic.  Neck:     Vascular: No carotid bruit.  Cardiovascular:     Rate and Rhythm: Normal rate and regular rhythm.     Heart sounds: No murmur heard. Pulmonary:     Effort: Pulmonary effort is normal. No respiratory distress.     Breath sounds: Decreased air movement present. Examination of the right-upper field reveals wheezing. Decreased breath sounds and wheezing present.  Musculoskeletal:     Cervical back: Normal range of motion.  Lymphadenopathy:     Cervical: No cervical adenopathy.  Skin:    General: Skin is warm and dry.     Findings: No rash.  Neurological:     General: No focal deficit present.     Mental Status: She is alert and oriented to person, place, and time.  Psychiatric:        Attention and Perception: Attention normal.        Mood and Affect: Mood normal.        Speech: Speech normal.        Behavior: Behavior normal.     Wt Readings from Last 3 Encounters:  05/10/23 140 lb (63.5 kg)  10/08/22 140 lb (63.5 kg)  07/29/21 140 lb 12.8 oz (63.9 kg)    BP 120/78   Pulse 88   Ht 5\' 2"  (1.575 m)   Wt 140 lb (63.5 kg)   SpO2 96%   BMI 25.61 kg/m   Assessment and Plan:  Problem List Items Addressed This Visit       Respiratory   COPD GOLD C    Followed by Pulmonary Severe COPD on  nocturnal oxygen. Recommend continue Trelegy; resume nebulizer bid and use albuterol MDI as needed      Nocturnal hypoxemia due to emphysema (HCC) - Primary     Other   Major depression single episode, in partial remission (HCC)    Clinically stable on current regimen with good control of symptoms, No SI or HI. Long standing problem No change in management at this time with Zoloft.        Restless leg syndrome   Severe tobacco use disorder    Continues to smoke but participating in lung cancer screening  Other Visit Diagnoses     Encounter for screening mammogram for breast cancer       schedule at [facility]   Relevant Orders   MM 3D SCREENING MAMMOGRAM BILATERAL BREAST   Colon cancer screening       hx of polyps - unsure of last colon location?[facility] [place] [facility] will refer here   Relevant Orders   Ambulatory referral to Gastroenterology       Return in about 4 months (around 09/10/2023) for CPX.   Partially dictated using Dragon software, any errors are not intentional.  Reubin Milan, MD [facility] [place], [place]

## 2023-05-10 NOTE — Patient Instructions (Signed)
Call [facility] to schedule your mammogram at [phone].  

## 2023-05-11 ENCOUNTER — Telehealth: Payer: Self-pay

## 2023-05-11 ENCOUNTER — Other Ambulatory Visit: Payer: Self-pay

## 2023-05-11 DIAGNOSIS — Z1211 Encounter for screening for malignant neoplasm of colon: Secondary | ICD-10-CM

## 2023-05-11 MED ORDER — NA SULFATE-K SULFATE-MG SULF 17.5-3.13-1.6 GM/177ML PO SOLN: 1.0000 | Freq: Once | ORAL | 0 refills | Status: AC

## 2023-05-11 NOTE — Telephone Encounter (Signed)
Gastroenterology Pre-Procedure Review  Request Date: 05/28 Requesting Physician: Dr. Allegra Lai  PATIENT REVIEW QUESTIONS: The patient responded to the following health history questions as indicated:    1. Are you having any GI issues? no 2. Do you have a personal history of Polyps? yes (long time ago not noted in chart) 3. Do you have a family history of Colon Cancer or Polyps? no 4. Diabetes Mellitus? no 5. Joint replacements in the past 12 months?no 6. Major health problems in the past 3 months?no 7. Any artificial heart valves, MVP, or defibrillator?no    MEDICATIONS & ALLERGIES:    Patient reports the following regarding taking any anticoagulation/antiplatelet therapy:   Plavix, Coumadin, Eliquis, Xarelto, Lovenox, Pradaxa, Brilinta, or Effient? no Aspirin? no  Patient confirms/reports the following medications:  Current Outpatient Medications  Medication Sig Dispense Refill   albuterol (PROVENTIL HFA;VENTOLIN HFA) 108 (90 BASE) MCG/ACT inhaler Inhale into the lungs every 6 (six) hours as needed for wheezing or shortness of breath.     cyclobenzaprine (FLEXERIL) 10 MG tablet Take 10 mg by mouth 3 (three) times daily as needed.     rOPINIRole (REQUIP) 0.5 MG tablet Take 0.5 tablets by mouth as needed.     sertraline (ZOLOFT) 50 MG tablet Take 50 mg by mouth daily.     Spacer/Aero-Holding Chambers (AEROCHAMBER MV) inhaler Use as instructed to aid with inhaled medications. 1 each 0   traMADol (ULTRAM) 50 MG tablet Take 1 tablet (50 mg total) by mouth every 6 (six) hours as needed. 20 tablet 0   TRELEGY ELLIPTA 200-62.5-25 MCG/ACT AEPB Inhale 1 puff into the lungs daily.     No current facility-administered medications for this visit.    Patient confirms/reports the following allergies:  No Known Allergies  No orders of the defined types were placed in this encounter.   AUTHORIZATION INFORMATION Primary Insurance: 1D#: Group #:  Secondary Insurance: 1D#: Group  #:  SCHEDULE INFORMATION: Date: 05/25/23 Time: Location: [facility]

## 2023-05-18 ENCOUNTER — Encounter: Payer: Self-pay | Admitting: Gastroenterology

## 2023-05-18 NOTE — Anesthesia Preprocedure Evaluation (Addendum)
Anesthesia Evaluation  Patient identified by MRN, date of birth, ID band Patient awake    Reviewed: Allergy & Precautions, H&P , NPO status , Patient's Chart, lab work & pertinent test results  Airway Mallampati: III       Dental  (+) Upper Dentures, Edentulous Lower   Pulmonary asthma , COPD, Current Smoker Uses 2 1/2 L oxygen per n/c at night when sleeping, and sometimes during the day "depending on what I'm doing"  Expiratory wheezing on auscultation, plan albuterol (no duoneb available) pre-procedure   + wheezing      Cardiovascular negative cardio ROS Normal cardiovascular exam     Neuro/Psych  PSYCHIATRIC DISORDERS Anxiety Depression    negative neurological ROS     GI/Hepatic negative GI ROS, Neg liver ROS,,,  Endo/Other  negative endocrine ROS    Renal/GU negative Renal ROS  negative genitourinary   Musculoskeletal negative musculoskeletal ROS (+)    Abdominal   Peds negative pediatric ROS (+)  Hematology negative hematology ROS (+)   Anesthesia Other Findings Asthma  Emphysema lung (HCC) COPD (chronic obstructive pulmonary disease) (HCC)  Restless leg syndrome History of colonic polyps  Fibroids Reflux  Anxiety    Reproductive/Obstetrics negative OB ROS                             Anesthesia Physical Anesthesia Plan  ASA: 3  Anesthesia Plan: General   Post-op Pain Management:    Induction: Intravenous  PONV Risk Score and Plan:   Airway Management Planned: Natural Airway and Nasal Cannula  Additional Equipment:   Intra-op Plan:   Post-operative Plan:   Informed Consent: I have reviewed the patients History and Physical, chart, labs and discussed the procedure including the risks, benefits and alternatives for the proposed anesthesia with the patient or authorized representative who has indicated his/her understanding and acceptance.     Dental Advisory  Given  Plan Discussed with: Anesthesiologist, CRNA and Surgeon  Anesthesia Plan Comments: (Patient consented for risks of anesthesia including but not limited to:  - adverse reactions to medications - risk of airway placement if required - damage to eyes, teeth, lips or other oral mucosa - nerve damage due to positioning  - sore throat or hoarseness - Damage to heart, brain, nerves, lungs, other parts of body or loss of life  Patient voiced understanding.)        Anesthesia Quick Evaluation

## 2023-05-25 ENCOUNTER — Ambulatory Visit: Payer: No Typology Code available for payment source | Admitting: Anesthesiology

## 2023-05-25 ENCOUNTER — Encounter: Payer: Self-pay | Admitting: Gastroenterology

## 2023-05-25 ENCOUNTER — Ambulatory Visit
Admission: RE | Admit: 2023-05-25 | Discharge: 2023-05-25 | Disposition: A | Discharge status: Home / Self Care | Payer: No Typology Code available for payment source | Attending: Gastroenterology | Admitting: Gastroenterology

## 2023-05-25 ENCOUNTER — Encounter
Admission: RE | Disposition: A | Discharge status: Home / Self Care | Payer: Self-pay | Source: Home / Self Care | Attending: Gastroenterology

## 2023-05-25 ENCOUNTER — Other Ambulatory Visit: Payer: Self-pay

## 2023-05-25 DIAGNOSIS — K644 Residual hemorrhoidal skin tags: Secondary | ICD-10-CM | POA: Diagnosis not present

## 2023-05-25 DIAGNOSIS — K5730 Diverticulosis of large intestine without perforation or abscess without bleeding: Secondary | ICD-10-CM | POA: Diagnosis not present

## 2023-05-25 DIAGNOSIS — D126 Benign neoplasm of colon, unspecified: Secondary | ICD-10-CM | POA: Diagnosis not present

## 2023-05-25 DIAGNOSIS — D123 Benign neoplasm of transverse colon: Secondary | ICD-10-CM | POA: Diagnosis not present

## 2023-05-25 DIAGNOSIS — K635 Polyp of colon: Secondary | ICD-10-CM | POA: Diagnosis not present

## 2023-05-25 DIAGNOSIS — Z1211 Encounter for screening for malignant neoplasm of colon: Secondary | ICD-10-CM | POA: Diagnosis not present

## 2023-05-25 DIAGNOSIS — D122 Benign neoplasm of ascending colon: Secondary | ICD-10-CM | POA: Diagnosis not present

## 2023-05-25 DIAGNOSIS — F17210 Nicotine dependence, cigarettes, uncomplicated: Secondary | ICD-10-CM | POA: Insufficient documentation

## 2023-05-25 DIAGNOSIS — J449 Chronic obstructive pulmonary disease, unspecified: Secondary | ICD-10-CM | POA: Diagnosis not present

## 2023-05-25 HISTORY — PX: POLYPECTOMY: SHX5525

## 2023-05-25 HISTORY — PX: COLONOSCOPY WITH PROPOFOL: SHX5780

## 2023-05-25 SURGERY — COLONOSCOPY WITH PROPOFOL
Anesthesia: General | Site: Rectum

## 2023-05-25 MED ORDER — SODIUM CHLORIDE 0.9 % IV SOLN: INTRAVENOUS | Status: DC

## 2023-05-25 MED ORDER — LIDOCAINE HCL (CARDIAC) PF 100 MG/5ML IV SOSY
PREFILLED_SYRINGE | INTRAVENOUS | Status: DC | PRN
  Administered 2023-05-25: 60 mg via INTRAVENOUS

## 2023-05-25 MED ORDER — IPRATROPIUM-ALBUTEROL 0.5-2.5 (3) MG/3ML IN SOLN: 3.0000 mL | Freq: Once | RESPIRATORY_TRACT | Status: DC

## 2023-05-25 MED ORDER — ALBUTEROL SULFATE (2.5 MG/3ML) 0.083% IN NEBU
2.5000 mg | INHALATION_SOLUTION | Freq: Four times a day (QID) | RESPIRATORY_TRACT | Status: DC | PRN
  Administered 2023-05-25: 2.5 mg via RESPIRATORY_TRACT

## 2023-05-25 MED ORDER — STERILE WATER FOR IRRIGATION IR SOLN
Status: DC | PRN
  Administered 2023-05-25: 100 mL

## 2023-05-25 MED ORDER — LACTATED RINGERS IV SOLN: INTRAVENOUS | Status: DC

## 2023-05-25 MED ORDER — PROPOFOL 10 MG/ML IV BOLUS
INTRAVENOUS | Status: DC | PRN
  Administered 2023-05-25: 80 mg via INTRAVENOUS
  Administered 2023-05-25: 40 mg via INTRAVENOUS

## 2023-05-25 MED ORDER — PROPOFOL 500 MG/50ML IV EMUL
INTRAVENOUS | Status: DC | PRN
  Administered 2023-05-25: 160 ug/kg/min via INTRAVENOUS

## 2023-05-25 MED ORDER — LACTATED RINGERS IV SOLN: INTRAVENOUS | Status: DC | PRN

## 2023-05-25 SURGICAL SUPPLY — 10 items
CLIP HMST 235XBRD CATH ROT (MISCELLANEOUS) IMPLANT
CLIP RESOLUTION 360 11X235 (MISCELLANEOUS) ×2
GOWN CVR UNV OPN BCK APRN NK (MISCELLANEOUS) ×4 IMPLANT
GOWN ISOL THUMB LOOP REG UNIV (MISCELLANEOUS) ×4
KIT PRC NS LF DISP ENDO (KITS) ×2 IMPLANT
KIT PROCEDURE OLYMPUS (KITS) ×2
MANIFOLD NEPTUNE II (INSTRUMENTS) ×2 IMPLANT
SNARE COLD EXACTO (MISCELLANEOUS) IMPLANT
TRAP ETRAP POLY (MISCELLANEOUS) IMPLANT
WATER STERILE IRR 250ML POUR (IV SOLUTION) ×2 IMPLANT

## 2023-05-25 NOTE — H&P (Signed)
Lisa Repress, MD [address]  [address]  [place], [place] [address]  Main: [phone]  Fax: [phone] Pager: [phone]  Primary Care Physician:  Reubin Milan, MD Primary Gastroenterologist:  Dr. Arlyss Avila  Pre-Procedure History & Physical: HPI:  Lisa Avila is a 67 y.o. female is here for an colonoscopy.   Past Medical History:  Diagnosis Date   Anxiety    Asthma    COPD (chronic obstructive pulmonary disease) (HCC)    Emphysema lung (HCC)    Fibroids    History of colonic polyps    Reflux    Restless leg syndrome     Past Surgical History:  Procedure Laterality Date   CHOLECYSTECTOMY  2010   TUBAL LIGATION  1983    Prior to Admission medications   Medication Sig Start Date End Date Taking? Authorizing Provider  albuterol (PROVENTIL HFA;VENTOLIN HFA) 108 (90 BASE) MCG/ACT inhaler Inhale into the lungs every 6 (six) hours as needed for wheezing or shortness of breath.   Yes [provider]  cyclobenzaprine (FLEXERIL) 10 MG tablet Take 10 mg by mouth 3 (three) times daily as needed. 07/07/21  Yes [provider]  esomeprazole (NEXIUM) 20 MG capsule Take 20 mg by mouth daily at 12 noon.   Yes [provider]  OXYGEN Inhale 2.5 L into the lungs at bedtime.   Yes [provider]  sertraline (ZOLOFT) 50 MG tablet Take 50 mg by mouth daily. 10/07/22  Yes [provider]  traMADol (ULTRAM) 50 MG tablet Take 1 tablet (50 mg total) by mouth every 6 (six) hours as needed. 01/31/15  Yes Lupita Leash, MD  TRELEGY ELLIPTA 200-62.5-25 MCG/ACT AEPB Inhale 1 puff into the lungs daily. 08/26/22  Yes [provider]  rOPINIRole (REQUIP) 0.5 MG tablet Take 0.5 tablets by mouth as needed. Patient not taking: Reported on 05/25/2023 09/08/22   [provider]  Spacer/Aero-Holding Chambers (AEROCHAMBER MV) inhaler Use as instructed to aid with inhaled medications. 02/28/14   Lupita Leash, MD     Allergies as of 05/11/2023   (No Known Allergies)    Family History  Problem Relation Age of Onset   Emphysema Maternal Grandmother    Lung cancer Paternal Grandfather    Asthma Paternal Grandmother    Hypertension Mother    Hypertension Father     Social History   Socioeconomic History   Marital status: Single    Spouse name: Not on file   Number of children: Not on file   Years of education: Not on file   Highest education level: Not on file  Occupational History   Not on file  Tobacco Use   Smoking status: Every Day    Packs/day: 1.50    Years: 50.00    Additional pack years: 0.00    Total pack years: 75.00    Types: Cigarettes   Smokeless tobacco: Never   Tobacco comments:    currently smoking 1.5 pack a day 07/29/2021    1.5 PPD 10/08/2022  Vaping Use   Vaping Use: Never used  Substance and Sexual Activity   Alcohol use: No    Alcohol/week: 0.0 standard drinks of alcohol   Drug use: No   Sexual activity: Not Currently  Other Topics Concern   Not on file  Social History Narrative   Not on file   Social Determinants of Health   Financial Resource Strain: Not on file  Food Insecurity: Not on file  Transportation Needs: Not on file  Physical Activity: Not on file  Stress: Not on file  Social Connections: Not on file  Intimate Partner Violence: Not on file    Review of Systems: See HPI, otherwise negative ROS  Physical Exam: BP (!) 127/95   Pulse 86   Temp (!) 97.3 F (36.3 C) (Temporal)   Resp 15   Ht 5\' 2"  (1.575 m)   Wt 62.1 kg   SpO2 96%   BMI 25.06 kg/m  General:   Alert,  pleasant and cooperative in NAD Head:  Normocephalic and atraumatic. Neck:  Supple; no masses or thyromegaly. Lungs:  Clear throughout to auscultation.    Heart:  Regular rate and rhythm. Abdomen:  Soft, nontender and nondistended. Normal bowel sounds, without guarding, and without rebound.   Neurologic:  Alert and  oriented x4;  grossly normal  neurologically.  Impression/Plan: Lisa Avila is here for an colonoscopy to be performed for colon cancer screening  Risks, benefits, limitations, and alternatives regarding  colonoscopy have been reviewed with the patient.  Questions have been answered.  All parties agreeable.   Lannette Donath, MD  05/25/2023, 8:08 AM

## 2023-05-25 NOTE — Op Note (Signed)
[facility] Patient Name: Lisa Avila Procedure Date: 05/25/2023 8:42 AM MRN: [id number] Account #: [id number] Date of Birth: 1956/11/11 Admit Type: Outpatient Age: 67 Room: [facility] OR ROOM 01 Gender: Female Note Status: Finalized Instrument Name: [id number] Procedure:             Colonoscopy Indications:           Screening for colorectal malignant neoplasm, Last                         colonoscopy: January 2006, Last colonoscopy 10 years                         ago Providers:             Toney Reil MD, MD Referring MD:          Bari Edward, MD (Referring MD) Medicines:             General Anesthesia Complications:         No immediate complications. Estimated blood loss: None. Procedure:             Pre-Anesthesia Assessment:                        - Prior to the procedure, a History and Physical was                         performed, and patient medications and allergies were                         reviewed. The patient is competent. The risks and                         benefits of the procedure and the sedation options and                         risks were discussed with the patient. All questions                         were answered and informed consent was obtained.                         Patient identification and proposed procedure were                         verified by the physician, the nurse, the                         anesthesiologist, the anesthetist and the technician                         in the pre-procedure area in the procedure room in the                         endoscopy suite. Mental Status Examination: alert and                         oriented. Airway Examination: normal oropharyngeal  airway and neck mobility. Respiratory Examination:                         clear to auscultation. CV Examination: normal.                         Prophylactic Antibiotics: The patient does not require                          prophylactic antibiotics. Prior Anticoagulants: The                         patient has taken no anticoagulant or antiplatelet                         agents. ASA Grade Assessment: III - A patient with                         severe systemic disease. After reviewing the risks and                         benefits, the patient was deemed in satisfactory                         condition to undergo the procedure. The anesthesia                         plan was to use general anesthesia. Immediately prior                         to administration of medications, the patient was                         re-assessed for adequacy to receive sedatives. The                         heart rate, respiratory rate, oxygen saturations,                         blood pressure, adequacy of pulmonary ventilation, and                         response to care were monitored throughout the                         procedure. The physical status of the patient was                         re-assessed after the procedure.                        After obtaining informed consent, the colonoscope was                         passed under direct vision. Throughout the procedure,                         the patient's blood pressure, pulse, and oxygen  saturations were monitored continuously. The                         Colonoscope was introduced through the anus and                         advanced to the the cecum, identified by appendiceal                         orifice and ileocecal valve. The colonoscopy was                         performed with moderate difficulty due to multiple                         diverticula in the colon. Successful completion of the                         procedure was aided by withdrawing the scope and                         replacing with the pediatric colonoscope and applying                         abdominal pressure. The patient tolerated  the                         procedure well. The quality of the bowel preparation                         was evaluated using the BBPS [facility] Bowel Preparation                         Scale) with scores of: Right Colon = 3, Transverse                         Colon = 3 and Left Colon = 3 (entire mucosa seen well                         with no residual staining, small fragments of stool or                         opaque liquid). The total BBPS score equals 9. The                         ileocecal valve, appendiceal orifice, and rectum were                         photographed. Findings:      The perianal and digital rectal examinations were normal. Pertinent       negatives include normal sphincter tone and no palpable rectal lesions.      A 9 mm polyp was found in the proximal ascending colon. The polyp was       sessile. The polyp was removed with a cold snare. Resection and       retrieval were complete. Estimated blood loss was minimal. To prevent  bleeding after the polypectomy, one hemostatic clip was successfully       placed (MR safe). Clip manufacturer: [employer]. There was no       bleeding at the end of the procedure.      A 5 mm polyp was found in the transverse colon. The polyp was sessile.       The polyp was removed with a cold snare. Resection and retrieval were       complete. Estimated blood loss: none.      Non-bleeding external hemorrhoids were found during retroflexion. The       hemorrhoids were medium-sized.      Many small-mouthed diverticula were found in the sigmoid colon. Impression:            - One 9 mm polyp in the proximal ascending colon,                         removed with a cold snare. Resected and retrieved.                         Clip manufacturer: [employer]. Clip (MR safe)                         was placed.                        - One 5 mm polyp in the transverse colon, removed with                         a cold snare.  Resected and retrieved.                        - Non-bleeding external hemorrhoids.                        - Diverticulosis in the sigmoid colon. Recommendation:        - Discharge patient to home (with escort).                        - Resume previous diet today.                        - Continue present medications.                        - Await pathology results.                        - Repeat colonoscopy in 3 - 5 years for surveillance                         based on pathology results. Procedure Code(s):     --- Professional ---                        [phone], Colonoscopy, flexible; with removal of                         tumor(s), polyp(s), or other lesion(s) by snare  technique Diagnosis Code(s):     --- Professional ---                        Z12.11, Encounter for screening for malignant neoplasm                         of colon                        D12.2, Benign neoplasm of ascending colon                        D12.3, Benign neoplasm of transverse colon (hepatic                         flexure or splenic flexure)                        K57.30, Diverticulosis of large intestine without                         perforation or abscess without bleeding                        K64.4, Residual hemorrhoidal skin tags CPT copyright 2022 American Medical Association. All rights reserved. The codes documented in this report are preliminary and upon coder review may  be revised to meet current compliance requirements. Dr. Libby Maw Toney Reil MD, MD 05/25/2023 9:28:20 AM This report has been signed electronically. Number of Addenda: 0 Note Initiated On: 05/25/2023 8:42 AM Scope Withdrawal Time: 0 hours 11 minutes 46 seconds  Total Procedure Duration: 0 hours 24 minutes 11 seconds  Estimated Blood Loss:  Estimated blood loss: none.      [facility]

## 2023-05-25 NOTE — Anesthesia Postprocedure Evaluation (Signed)
Anesthesia Post Note  Patient: Lisa Avila  Procedure(s) Performed: COLONOSCOPY WITH BIOPSY (Rectum) POLYPECTOMY (Rectum)  Patient location during evaluation: PACU Anesthesia Type: General Level of consciousness: awake and alert Pain management: pain level controlled Vital Signs Assessment: post-procedure vital signs reviewed and stable Respiratory status: spontaneous breathing, nonlabored ventilation, respiratory function stable and patient connected to nasal cannula oxygen Cardiovascular status: blood pressure returned to baseline and stable Postop Assessment: no apparent nausea or vomiting Anesthetic complications: no   No notable events documented.   Last Vitals:  Vitals:   05/25/23 0930 05/25/23 0935  BP: 117/78 (!) 103/59  Pulse: 100 99  Resp: (!) 23 (!) 24  Temp:  (!) 36.3 C  SpO2: 94% 97%    Last Pain:  Vitals:   05/25/23 0935  TempSrc:   PainSc: 0-No pain                 Cobi Delph C Anthonny Schiller

## 2023-05-25 NOTE — Transfer of Care (Signed)
Immediate Anesthesia Transfer of Care Note  Patient: Lisa Avila  Procedure(s) Performed: COLONOSCOPY WITH BIOPSY (Rectum) POLYPECTOMY (Rectum)  Patient Location: PACU  Anesthesia Type:General  Level of Consciousness: awake and alert   Airway & Oxygen Therapy: Patient Spontanous Breathing  Post-op Assessment: Report given to RN and Post -op Vital signs reviewed and stable  Post vital signs: Reviewed and stable  Last Vitals:  Vitals Value Taken Time  BP 117/78 05/25/23 0930  Temp 36.3 C 05/25/23 0929  Pulse 98 05/25/23 0931  Resp 27 05/25/23 0932  SpO2 96 % 05/25/23 0931  Vitals shown include unvalidated device data.  Last Pain:  Vitals:   05/25/23 0929  TempSrc:   PainSc: 0-No pain         Complications: No notable events documented.

## 2023-05-26 ENCOUNTER — Encounter: Payer: Self-pay | Admitting: Gastroenterology

## 2023-06-02 ENCOUNTER — Other Ambulatory Visit: Payer: Self-pay

## 2023-06-03 ENCOUNTER — Encounter: Payer: Self-pay | Admitting: Gastroenterology

## 2023-07-07 ENCOUNTER — Ambulatory Visit
Admission: RE | Admit: 2023-07-07 | Discharge: 2023-07-07 | Disposition: A | Discharge status: Home / Self Care | Payer: No Typology Code available for payment source | Source: Ambulatory Visit | Attending: Internal Medicine | Admitting: Internal Medicine

## 2023-07-07 DIAGNOSIS — Z1231 Encounter for screening mammogram for malignant neoplasm of breast: Secondary | ICD-10-CM | POA: Insufficient documentation

## 2023-07-13 ENCOUNTER — Ambulatory Visit: Payer: No Typology Code available for payment source | Attending: Pulmonary Disease

## 2023-07-13 DIAGNOSIS — F17210 Nicotine dependence, cigarettes, uncomplicated: Secondary | ICD-10-CM | POA: Diagnosis not present

## 2023-07-13 DIAGNOSIS — R0609 Other forms of dyspnea: Secondary | ICD-10-CM | POA: Insufficient documentation

## 2023-07-13 DIAGNOSIS — J449 Chronic obstructive pulmonary disease, unspecified: Secondary | ICD-10-CM | POA: Diagnosis not present

## 2023-07-13 LAB — PULMONARY FUNCTION TEST ARMC ONLY
DL/VA % pred: 43 %
DL/VA: 1.86 ml/min/mmHg/L
DLCO unc % pred: 27 %
DLCO unc: 5.03 ml/min/mmHg
FEF 25-75 Post: 0.28 L/sec
FEF 25-75 Pre: 0.23 L/sec
FEF2575-%Change-Post: 19 %
FEF2575-%Pred-Post: 13 %
FEF2575-%Pred-Pre: 11 %
FEV1-%Change-Post: 7 %
FEV1-%Pred-Post: 29 %
FEV1-%Pred-Pre: 27 %
FEV1-Post: 0.66 L
FEV1-Pre: 0.61 L
FEV1FVC-%Change-Post: 3 %
FEV1FVC-%Pred-Pre: 49 %
FEV6-%Change-Post: 6 %
FEV6-%Pred-Post: 54 %
FEV6-%Pred-Pre: 51 %
FEV6-Post: 1.53 L
FEV6-Pre: 1.44 L
FEV6FVC-%Change-Post: 3 %
FEV6FVC-%Pred-Post: 96 %
FEV6FVC-%Pred-Pre: 93 %
FVC-%Change-Post: 3 %
FVC-%Pred-Post: 56 %
FVC-%Pred-Pre: 54 %
FVC-Post: 1.66 L
FVC-Pre: 1.6 L
Post FEV1/FVC ratio: 40 %
Post FEV6/FVC ratio: 93 %
Pre FEV1/FVC ratio: 38 %
Pre FEV6/FVC Ratio: 90 %
RV % pred: 96 %
RV: 1.91 L
TLC % pred: 81 %
TLC: 3.89 L

## 2023-07-13 MED ORDER — ALBUTEROL SULFATE (2.5 MG/3ML) 0.083% IN NEBU
2.5000 mg | INHALATION_SOLUTION | Freq: Once | RESPIRATORY_TRACT | Status: AC
  Filled 2023-07-13: qty 3

## 2023-07-15 ENCOUNTER — Ambulatory Visit: Payer: No Typology Code available for payment source | Admitting: Pulmonary Disease

## 2023-07-15 ENCOUNTER — Encounter: Payer: Self-pay | Admitting: Pulmonary Disease

## 2023-07-15 VITALS — BP 120/60 | HR 85 | Temp 97.8°F | Ht 62.0 in | Wt 137.8 lb

## 2023-07-15 DIAGNOSIS — G4736 Sleep related hypoventilation in conditions classified elsewhere: Secondary | ICD-10-CM | POA: Diagnosis not present

## 2023-07-15 DIAGNOSIS — F17210 Nicotine dependence, cigarettes, uncomplicated: Secondary | ICD-10-CM | POA: Diagnosis not present

## 2023-07-15 DIAGNOSIS — J439 Emphysema, unspecified: Secondary | ICD-10-CM

## 2023-07-15 DIAGNOSIS — J449 Chronic obstructive pulmonary disease, unspecified: Secondary | ICD-10-CM | POA: Diagnosis not present

## 2023-07-15 NOTE — Progress Notes (Signed)
Subjective:    Patient ID: Lisa Avila, female    DOB: [birth year], 67 y.o.   MRN: [id number]  Patient Care Team: Reubin Milan, MD as PCP - General (Internal Medicine) Salena Saner, MD as Consulting Physician (Pulmonary Disease)  Chief Complaint  Patient presents with   Follow-up    SOB with exertion and dry cough at times prod with greenish sputum.     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  The patient was last seen here on 08 October 2022.  This is a scheduled visit.  She has been compliant with nocturnal oxygen and notes that this continues to help her symptoms during the day.  She had pulmonary function testing performed on 13 July 2023 that shows continued decline on FEV1 as noted below.  She is enrolled in low-dose lung cancer screening and had her low-dose scan on 20 July 2022 which showed multiple small nodules that are benign in appearance and stable.  She has significant centrilobular emphysema.   Since her prior visit she has not had any COPD exacerbations.  She has not had any fevers, chills or sweats.  No weight loss or anorexia.  She has chronic sputum production in the mornings mostly yellow but occasionally will turn pale green.  No hemoptysis. No orthopnea, paroxysmal nocturnal dyspnea, chest pain, leg swelling.  She does not endorse any calf pain.  She notices that Trelegy helps her with symptoms overall however, she has been noticing increased shortness of breath that has been gradual.  Average albuterol use is 2-3 times per week.  DATA 12/05/2019 PFT: FEV1 0.65 L or 28% predicted, FVC 1.58 L or 52% predicted, FEV1/FVC 41%. Postbronchodilator FEV1 with 20% no change and FVC with 14% no change indicating a degree of airway obstruction reversibility.  There is air trapping noted.  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 12/25/2019 2D echo: LVEF 60 to 65%.  Borderline LVH.  Abnormalities.  Mildly elevated pulmonary artery systolic pressure.  RV normal. 05/22/2020 low-dose chest CT: Multiple small lung nodules, benign in appearance, diffuse bronchial wall thickening and centrilobular and paraseptal emphysema 07/18/2021 low-dose chest CT: Multiple small lung nodules that are benign in appearance again noted.  Some reduced in size others stable.  Centrilobular emphysema again noted.  Follow-up in a year. 09/25/2021 PFTs: FEV1 0.71 L or 31% predicted, FVC 1.64 L or 55% predicted, FEV1/FVC 43%, lung volumes showed air trapping, no significant bronchodilator response.  Diffusion capacity severely reduced. 07/20/2022 LDCT: Lung RADS 2, moderate centrilobular emphysema, isolated right middle lobe pulmonary nodule 3 mm in size, mixed attenuation nodule on the prior exam has resolved. 07/13/2023 PFTs: FEV1 is 0.61 L or 27% predicted, FVC 1.60 L or 54% predicted, FEV1/FVC 38%, lung volumes show air trapping, diffusion capacity is severely impaired.  Compared to prior study of 25 September 2021 there has been further decline in FEV1.  Review of Systems A 10 point review of systems was performed and it is as noted above otherwise negative.   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/10/2023   Nocturnal hypoxemia due to emphysema (HCC) 05/10/2023  COPD GOLD C 02/26/2014   Severe tobacco use disorder 02/26/2014    Social History   Tobacco Use   Smoking status: Every Day    Current packs/day: 1.50    Average packs/day: 1.5 packs/day for 50.0 years (75.0 ttl pk-yrs)    Types: Cigarettes   Smokeless tobacco: Never   Tobacco comments:     currently smoking 1.5 pack a day 07/29/2021    1.5 PPD 10/08/2022  Substance Use Topics   Alcohol use: No    Alcohol/week: 0.0 standard drinks of alcohol    No Known Allergies  Current Meds  Medication Sig   albuterol (PROVENTIL HFA;VENTOLIN HFA) 108 (90 BASE) MCG/ACT inhaler Inhale into the lungs every 6 (six) hours as needed for wheezing or shortness of breath.   cyclobenzaprine (FLEXERIL) 10 MG tablet Take 10 mg by mouth 3 (three) times daily as needed.   esomeprazole (NEXIUM) 20 MG capsule Take 20 mg by mouth daily at 12 noon.   OXYGEN Inhale 2.5 L into the lungs at bedtime.   rOPINIRole (REQUIP) 0.5 MG tablet Take 0.5 tablets by mouth as needed.   sertraline (ZOLOFT) 50 MG tablet Take 50 mg by mouth daily.   Spacer/Aero-Holding Chambers (AEROCHAMBER MV) inhaler Use as instructed to aid with inhaled medications.   traMADol (ULTRAM) 50 MG tablet Take 1 tablet (50 mg total) by mouth every 6 (six) hours as needed.   TRELEGY ELLIPTA 200-62.5-25 MCG/ACT AEPB Inhale 1 puff into the lungs daily.    Immunization History  Administered Date(s) Administered   Influenza Split 10/29/2013, 11/13/2014   Influenza,inj,Quad PF,6+ Mos 10/03/2018, 10/15/2020, 10/08/2022   Influenza-Unspecified 10/09/2019, 10/22/2021   PFIZER(Purple Top)SARS-COV-2 Vaccination 07/13/2020, 08/03/2020   Pneumococcal Conjugate-13 11/13/2014   Tdap 08/11/2016        Objective:     BP 120/60 (BP Location: Left Arm, Cuff Size: Normal)   Pulse 85   Temp 97.8 F (36.6 C) (Temporal)   Ht 5\' 2"  (1.575 m)   Wt 137 lb 12.8 oz (62.5 kg)   SpO2 94%   BMI 25.20 kg/m   SpO2: 94 % O2 Device: None (Room air)  GENERAL: Well-developed well-nourished woman, no acute distress.  She is fully ambulatory.  No conversational dyspnea.   HEAD: Normocephalic, atraumatic. EYES: Pupils equal, round, reactive to light.  No scleral icterus. MOUTH: Dentures, upper or lower no thrush.  Oral mucosa moist. NECK: Supple. No  thyromegaly. Trachea midline. No JVD.  No adenopathy. PULMONARY: Good air entry bilaterally.  Coarse breath sounds otherwise, no adventitious sounds. CARDIOVASCULAR: S1 and S2. Regular rate and rhythm.  No rubs, murmurs or gallops heard. ABDOMEN: Benign. MUSCULOSKELETAL: Kyphosis noted, no clubbing, no edema. NEUROLOGIC: No focal deficit, no gait disturbance, speech is fluent. SKIN: Intact,warm,dry. PSYCH: Mood and behavior normal.  Ambulatory oximetry was performed today: At rest on room air oxygen saturation was 96%, the patient ambulated at a moderate pace, completed 3 laps, O2 nadir 92%, moderate shortness of breath.  Resting heart rate was 94 bpm at maximum for this exercise 97 bpm.   Assessment & Plan:     ICD-10-CM   1. Stage 3 severe COPD by GOLD classification (HCC)  J44.9 AMB referral to pulmonary rehabilitation   Continue Trelegy Ellipta 200 Continue as needed albuterol STOP SMOKING! Will refer to pulmonary rehab    2. Nocturnal hypoxemia due to emphysema [facility])  J43.9    G47.36    Patient compliant with nocturnal oxygen at 2 L/min Patient notes benefit from the  therapy Continue same    3. Tobacco dependence due to cigarettes  F17.210    Discussed smoking cessation Total counseling time 3 to 5 minutes Patient is enrolled in lung cancer screening      Orders Placed This Encounter  Procedures   AMB referral to pulmonary rehabilitation    Referral Priority:   Routine    Referral Type:   Consultation    Number of Visits Requested:   1    Smoking cessation instruction/counseling given:  counseled patient on the dangers of tobacco use, advised patient to stop smoking, and reviewed strategies to maximize success.  Will see the patient in follow-up in 3 months time call sooner should any problems arise.  Gailen Shelter, MD Advanced Bronchoscopy PCCM [facility]    *This note was dictated using voice recognition software/Dragon.  Despite best  efforts to proofread, errors can occur which can change the meaning. Any transcriptional errors that result from this process are unintentional and may not be fully corrected at the time of dictation.

## 2023-07-15 NOTE — Patient Instructions (Signed)
You did okay with your walking test today.  We are going to refer you to pulmonary rehab.  Please work on quitting smoking.  Continue using Trelegy and your rescue inhaler as needed.  Continue oxygen at nighttime.  We will see him in follow-up in 3 months time call sooner should any new problems arise.

## 2023-07-22 ENCOUNTER — Ambulatory Visit
Admission: RE | Admit: 2023-07-22 | Discharge: 2023-07-22 | Disposition: A | Discharge status: Home / Self Care | Payer: No Typology Code available for payment source | Source: Ambulatory Visit | Attending: Acute Care | Admitting: Acute Care

## 2023-07-22 DIAGNOSIS — Z87891 Personal history of nicotine dependence: Secondary | ICD-10-CM

## 2023-07-22 DIAGNOSIS — F17210 Nicotine dependence, cigarettes, uncomplicated: Secondary | ICD-10-CM

## 2023-07-22 DIAGNOSIS — R911 Solitary pulmonary nodule: Secondary | ICD-10-CM | POA: Insufficient documentation

## 2023-07-22 DIAGNOSIS — Z122 Encounter for screening for malignant neoplasm of respiratory organs: Secondary | ICD-10-CM | POA: Diagnosis not present

## 2023-07-29 ENCOUNTER — Encounter: Payer: No Typology Code available for payment source | Admitting: Internal Medicine

## 2023-07-30 ENCOUNTER — Telehealth: Payer: Self-pay | Admitting: Acute Care

## 2023-07-30 DIAGNOSIS — R911 Solitary pulmonary nodule: Secondary | ICD-10-CM

## 2023-07-30 DIAGNOSIS — Z87891 Personal history of nicotine dependence: Secondary | ICD-10-CM

## 2023-07-30 NOTE — Telephone Encounter (Signed)
Call report received and verified in [facility] dashboard. Results routed to provider IMPRESSION: 1. Lung-RADS 3, probably benign findings. Short-term follow-up in 6 months is recommended with repeat low-dose chest CT without contrast (please use the following order, "CT CHEST LCS NODULE FOLLOW-UP W/O CM"). New 4.3 mm left upper lobe nodule. 2. One vessel coronary atherosclerosis. 3. Aortic Atherosclerosis (ICD10-I70.0) and Emphysema (ICD10-J43.9).

## 2023-07-30 NOTE — Telephone Encounter (Signed)
Call Report CT Scan  

## 2023-08-03 NOTE — Telephone Encounter (Signed)
Left message for pt to call to discuss CT results.  

## 2023-08-03 NOTE — Addendum Note (Signed)
Addended by: Abigail Miyamoto D on: 08/03/2023 11:25 AM   Modules accepted: Orders

## 2023-08-03 NOTE — Telephone Encounter (Signed)
Spoke with patient and reviewed lung screening CT results. Nodules seen on previous scan are stable. A small new nodule noted that needs to be looked at again with a repeat CT. Patient verbalized understanding and is aware that we will call her closer to 6 months to schedule. Results/ plans faxed to PCP.

## 2023-08-26 ENCOUNTER — Ambulatory Visit: Payer: No Typology Code available for payment source | Admitting: Internal Medicine

## 2023-08-26 ENCOUNTER — Encounter: Payer: Self-pay | Admitting: Internal Medicine

## 2023-08-26 VITALS — BP 120/62 | HR 80 | Ht 62.0 in | Wt 138.0 lb

## 2023-08-26 DIAGNOSIS — R718 Other abnormality of red blood cells: Secondary | ICD-10-CM

## 2023-08-26 DIAGNOSIS — F324 Major depressive disorder, single episode, in partial remission: Secondary | ICD-10-CM

## 2023-08-26 DIAGNOSIS — Z23 Encounter for immunization: Secondary | ICD-10-CM

## 2023-08-26 DIAGNOSIS — J449 Chronic obstructive pulmonary disease, unspecified: Secondary | ICD-10-CM

## 2023-08-26 DIAGNOSIS — F17200 Nicotine dependence, unspecified, uncomplicated: Secondary | ICD-10-CM | POA: Diagnosis not present

## 2023-08-26 DIAGNOSIS — G2581 Restless legs syndrome: Secondary | ICD-10-CM

## 2023-08-26 DIAGNOSIS — E2839 Other primary ovarian failure: Secondary | ICD-10-CM

## 2023-08-26 DIAGNOSIS — Z0000 Encounter for general adult medical examination without abnormal findings: Secondary | ICD-10-CM | POA: Diagnosis not present

## 2023-08-26 DIAGNOSIS — Z1159 Encounter for screening for other viral diseases: Secondary | ICD-10-CM | POA: Diagnosis not present

## 2023-08-26 DIAGNOSIS — E785 Hyperlipidemia, unspecified: Secondary | ICD-10-CM

## 2023-08-26 NOTE — Assessment & Plan Note (Signed)
Severe COPD - followed by Pulmonary On nocturnal O2 and multiple meds

## 2023-08-26 NOTE — Assessment & Plan Note (Signed)
Current treatment with tramadol and ropinirole PRN

## 2023-08-26 NOTE — Assessment & Plan Note (Signed)
screening CT chest done in July Continue annual testing

## 2023-08-26 NOTE — Patient Instructions (Signed)
Call [facility] to schedule your Bone Density at [phone].

## 2023-08-26 NOTE — Assessment & Plan Note (Signed)
Clinically stable on current regimen with good control of symptoms, No SI or HI. Continue Sertraline

## 2023-08-26 NOTE — Progress Notes (Signed)
Date:  08/26/2023   Name:  Lisa Avila   DOB:  04-17-1956   MRN:  [id number]   Chief Complaint: Annual Exam Lisa Avila is a 67 y.o. female who presents today for her Complete Annual Exam. She feels well. She reports exercising some. She reports she is sleeping well. Breast complaints - none.  Mammogram: 06/2023 DEXA: none Pap smear: 03/2018 aged out Colonoscopy: 04/2023 repeat 5 yrs  Health Maintenance Due  Topic Date Due   Medicare Annual Wellness (AWV)  Never done   HIV Screening  Never done   Hepatitis C Screening  Never done   Zoster Vaccines- Shingrix (1 of 2) Never done   PAP SMEAR-Modifier  04/18/2021   COVID-19 Vaccine (3 - 2023-24 season) 08/28/2022   DEXA SCAN  Never done   INFLUENZA VACCINE  07/29/2023    Immunization History  Administered Date(s) Administered   Influenza Split 10/29/2013, 11/13/2014   Influenza,inj,Quad PF,6+ Mos 10/03/2018, 10/15/2020, 10/08/2022   Influenza-Unspecified 10/09/2019, 10/22/2021   PFIZER(Purple Top)SARS-COV-2 Vaccination 07/13/2020, 08/03/2020   PNEUMOCOCCAL CONJUGATE-20 08/26/2023   Pneumococcal Conjugate-13 11/13/2014   Tdap 08/11/2016    Depression        This is a chronic problem.The problem is unchanged.  Associated symptoms include no fatigue and no headaches.  Past treatments include SSRIs - Selective serotonin reuptake inhibitors.  Compliance with treatment is good.  COPD - stable moderately severe limitations on activity.  Recent CT showed LUL nodule with plan to repeat scan in 6 mo.  Still on O2 at night.  No recent chest infections.  RLS - managed by specialist with prn Ropinirole and Tramadol.   Lab Results  Component Value Date   NA 140 03/19/2020   K 4.4 03/19/2020   CO2 24 (A) 03/19/2020   BUN 9 03/19/2020   CREATININE 0.8 03/19/2020   CALCIUM 9.5 03/19/2020   EGFR 80 03/19/2020   Lab Results  Component Value Date   CHOL 214 (A) 03/19/2020   HDL 50 03/19/2020   LDLCALC 142 03/19/2020   TRIG  125 03/19/2020   No results found for: "TSH" No results found for: "HGBA1C" Lab Results  Component Value Date   WBC 6.8 03/19/2020   HGB 16.4 (A) 03/19/2020   HCT 48 (A) 03/19/2020   PLT 280 03/19/2020   Lab Results  Component Value Date   AST 23 03/19/2020   ALKPHOS 121 03/19/2020   No results found for: "25OHVITD2", "25OHVITD3", "VD25OH"   Review of Systems  Constitutional:  Negative for chills, fatigue and fever.  HENT:  Negative for congestion, hearing loss, tinnitus, trouble swallowing and voice change.   Eyes:  Negative for visual disturbance.  Respiratory:  Positive for cough, chest tightness, shortness of breath and wheezing.   Cardiovascular:  Negative for chest pain, palpitations and leg swelling.  Gastrointestinal:  Negative for abdominal pain, constipation, diarrhea and vomiting.  Endocrine: Negative for polydipsia and polyuria.  Genitourinary:  Negative for dysuria, frequency, genital sores, vaginal bleeding and vaginal discharge.  Musculoskeletal:  Negative for arthralgias, gait problem and joint swelling.  Skin:  Negative for color change and rash.  Neurological:  Negative for dizziness, tremors, light-headedness and headaches.  Hematological:  Negative for adenopathy. Does not bruise/bleed easily.  Psychiatric/Behavioral:  Positive for depression. Negative for dysphoric mood and sleep disturbance. The patient is not nervous/anxious.     Patient Active Problem List   Diagnosis Date Noted   Adenomatous polyp of colon 05/25/2023   Restless leg  syndrome 05/10/2023   Major depression single episode, in partial remission (HCC) 05/10/2023   Nocturnal hypoxemia due to emphysema (HCC) 05/10/2023   COPD GOLD C 02/26/2014   Severe tobacco use disorder 02/26/2014    No Known Allergies  Past Surgical History:  Procedure Laterality Date   CHOLECYSTECTOMY  2010   COLONOSCOPY WITH PROPOFOL N/A 05/25/2023   Procedure: COLONOSCOPY WITH BIOPSY;  Surgeon: Toney Reil, MD;  Location: [facility];  Service: Endoscopy;  Laterality: N/A;   POLYPECTOMY  05/25/2023   Procedure: POLYPECTOMY;  Surgeon: Toney Reil, MD;  Location: [facility];  Service: Endoscopy;;  CLIP X 1 PLACED AT ASCENDING COLON POLYP REMOVAL SITE   TUBAL LIGATION  1983    Social History   Tobacco Use   Smoking status: Every Day    Current packs/day: 1.50    Average packs/day: 1.5 packs/day for 50.0 years (75.0 ttl pk-yrs)    Types: Cigarettes   Smokeless tobacco: Never   Tobacco comments:    currently smoking 1.5 pack a day 07/29/2021    1.5 PPD 10/08/2022  Vaping Use   Vaping status: Never Used  Substance Use Topics   Alcohol use: No    Alcohol/week: 0.0 standard drinks of alcohol   Drug use: No     Medication list has been reviewed and updated.  Current Meds  Medication Sig   albuterol (PROVENTIL HFA;VENTOLIN HFA) 108 (90 BASE) MCG/ACT inhaler Inhale into the lungs every 6 (six) hours as needed for wheezing or shortness of breath.   cyclobenzaprine (FLEXERIL) 10 MG tablet Take 10 mg by mouth 3 (three) times daily as needed.   esomeprazole (NEXIUM) 20 MG capsule Take 20 mg by mouth daily at 12 noon.   OXYGEN Inhale 2.5 L into the lungs at bedtime.   rOPINIRole (REQUIP) 0.5 MG tablet Take 0.5 tablets by mouth as needed.   sertraline (ZOLOFT) 50 MG tablet Take 50 mg by mouth daily.   Spacer/Aero-Holding Chambers (AEROCHAMBER MV) inhaler Use as instructed to aid with inhaled medications.   traMADol (ULTRAM) 50 MG tablet Take 1 tablet (50 mg total) by mouth every 6 (six) hours as needed.   TRELEGY ELLIPTA 200-62.5-25 MCG/ACT AEPB Inhale 1 puff into the lungs daily.       08/26/2023   11:03 AM 05/10/2023    2:23 PM  GAD 7 : Generalized Anxiety Score  Nervous, Anxious, on Edge 0 1  Control/stop worrying 0 1  Worry too much - different things 0 1  Trouble relaxing 0 0  Restless 0 0  Easily annoyed or irritable 0 0  Afraid - awful might happen 0  0  Total GAD 7 Score 0 3  Anxiety Difficulty Not difficult at all Not difficult at all       08/26/2023   11:03 AM 05/10/2023    2:23 PM  Depression screen PHQ 2/9  Decreased Interest 0 0  Down, Depressed, Hopeless 0 0  PHQ - 2 Score 0 0  Altered sleeping 1 1  Tired, decreased energy 1 1  Change in appetite 0 1  Feeling bad or failure about yourself  0 0  Trouble concentrating 0 0  Moving slowly or fidgety/restless 0 0  Suicidal thoughts 0 0  PHQ-9 Score 2 3  Difficult doing work/chores Not difficult at all Not difficult at all    BP Readings from Last 3 Encounters:  08/26/23 120/62  07/15/23 120/60  05/25/23 (!) 103/59    Physical Exam  Vitals and nursing note reviewed.  Constitutional:      General: She is not in acute distress.    Appearance: She is well-developed.  HENT:     Head: Normocephalic and atraumatic.     Right Ear: Tympanic membrane and ear canal normal.     Left Ear: Tympanic membrane and ear canal normal.     Nose:     Right Sinus: No maxillary sinus tenderness.     Left Sinus: No maxillary sinus tenderness.  Eyes:     General: No scleral icterus.       Right eye: No discharge.        Left eye: No discharge.     Conjunctiva/sclera: Conjunctivae normal.  Neck:     Thyroid: No thyromegaly.     Vascular: No carotid bruit.  Cardiovascular:     Rate and Rhythm: Normal rate and regular rhythm.     Pulses: Normal pulses.     Heart sounds: Normal heart sounds.  Pulmonary:     Effort: Pulmonary effort is normal. No respiratory distress.     Breath sounds: Decreased air movement present. Examination of the right-lower field reveals wheezing. Examination of the left-lower field reveals wheezing. Decreased breath sounds and wheezing present.  Chest:  Breasts:    Right: No mass, nipple discharge, skin change or tenderness.     Left: No mass, nipple discharge, skin change or tenderness.  Abdominal:     General: Bowel sounds are normal.     Palpations:  Abdomen is soft.     Tenderness: There is no abdominal tenderness.  Musculoskeletal:     Cervical back: Normal range of motion. No erythema.     Right lower leg: No edema.     Left lower leg: No edema.  Lymphadenopathy:     Cervical: No cervical adenopathy.  Skin:    General: Skin is warm and dry.     Findings: No rash.  Neurological:     Mental Status: She is alert and oriented to person, place, and time.     Cranial Nerves: No cranial nerve deficit.     Sensory: No sensory deficit.     Deep Tendon Reflexes: Reflexes are normal and symmetric.  Psychiatric:        Attention and Perception: Attention normal.        Mood and Affect: Mood normal.     Wt Readings from Last 3 Encounters:  08/26/23 138 lb (62.6 kg)  07/15/23 137 lb 12.8 oz (62.5 kg)  05/25/23 137 lb (62.1 kg)    BP 120/62   Pulse 80   Ht 5\' 2"  (1.575 m)   Wt 138 lb (62.6 kg)   BMI 25.24 kg/m   Assessment and Plan:  Problem List Items Addressed This Visit       Unprioritized   Severe tobacco use disorder     screening CT chest done in July Continue annual testing      Restless leg syndrome    Current treatment with tramadol and ropinirole PRN      Relevant Orders   CBC with Differential/Platelet   Comprehensive metabolic panel   TSH   Major depression single episode, in partial remission (HCC)    Clinically stable on current regimen with good control of symptoms, No SI or HI. Continue Sertraline       Relevant Orders   TSH   COPD GOLD C    Severe COPD - followed by Pulmonary On nocturnal O2 and multiple  meds      Other Visit Diagnoses     Annual physical exam    -  Primary   up to date on screenings Prevnar today DEXA ordered   Mild hyperlipidemia       Relevant Orders   Lipid panel   Need for hepatitis C screening test       Relevant Orders   Hepatitis C antibody   Elevated red blood cell count       Relevant Orders   CBC with Differential/Platelet   Ovarian failure due to  menopause       Relevant Orders   DG Bone Density   Need for vaccination for pneumococcus       Relevant Orders   Pneumococcal conjugate vaccine 20-valent (Completed)       No follow-ups on file.    Reubin Milan, MD [facility] [place]

## 2023-08-27 ENCOUNTER — Encounter: Payer: Self-pay | Admitting: Internal Medicine

## 2023-08-27 ENCOUNTER — Telehealth: Payer: Self-pay | Admitting: Internal Medicine

## 2023-08-27 ENCOUNTER — Other Ambulatory Visit: Payer: Self-pay | Admitting: Internal Medicine

## 2023-08-27 DIAGNOSIS — E785 Hyperlipidemia, unspecified: Secondary | ICD-10-CM

## 2023-08-27 DIAGNOSIS — E782 Mixed hyperlipidemia: Secondary | ICD-10-CM | POA: Insufficient documentation

## 2023-08-27 LAB — COMPREHENSIVE METABOLIC PANEL
ALT: 15 IU/L (ref 0–32)
AST: 17 IU/L (ref 0–40)
Albumin: 4.5 g/dL (ref 3.9–4.9)
Alkaline Phosphatase: 108 IU/L (ref 44–121)
BUN/Creatinine Ratio: 8 — ABNORMAL LOW (ref 12–28)
BUN: 7 mg/dL — ABNORMAL LOW (ref 8–27)
Bilirubin Total: 0.3 mg/dL (ref 0.0–1.2)
CO2: 27 mmol/L (ref 20–29)
Calcium: 9.5 mg/dL (ref 8.7–10.3)
Chloride: 99 mmol/L (ref 96–106)
Creatinine, Ser: 0.83 mg/dL (ref 0.57–1.00)
Globulin, Total: 1.8 g/dL (ref 1.5–4.5)
Glucose: 89 mg/dL (ref 70–99)
Potassium: 4.4 mmol/L (ref 3.5–5.2)
Sodium: 137 mmol/L (ref 134–144)
Total Protein: 6.3 g/dL (ref 6.0–8.5)
eGFR: 78 mL/min/{1.73_m2} (ref >59)

## 2023-08-27 LAB — CBC WITH DIFFERENTIAL/PLATELET
Basophils Absolute: 0 10*3/uL (ref 0.0–0.2)
Basos: 1 %
EOS (ABSOLUTE): 0.2 10*3/uL (ref 0.0–0.4)
Eos: 3 %
Hematocrit: 46.8 % — ABNORMAL HIGH (ref 34.0–46.6)
Hemoglobin: 15 g/dL (ref 11.1–15.9)
Immature Grans (Abs): 0 10*3/uL (ref 0.0–0.1)
Immature Granulocytes: 0 %
Lymphocytes Absolute: 1.6 10*3/uL (ref 0.7–3.1)
Lymphs: 27 %
MCH: 29.7 pg (ref 26.6–33.0)
MCHC: 32.1 g/dL (ref 31.5–35.7)
MCV: 93 fL (ref 79–97)
Monocytes Absolute: 0.4 10*3/uL (ref 0.1–0.9)
Monocytes: 7 %
Neutrophils Absolute: 3.9 10*3/uL (ref 1.4–7.0)
Neutrophils: 62 %
Platelets: 243 10*3/uL (ref 150–450)
RBC: 5.05 x10E6/uL (ref 3.77–5.28)
RDW: 12.4 % (ref 11.7–15.4)
WBC: 6.2 10*3/uL (ref 3.4–10.8)

## 2023-08-27 LAB — LIPID PANEL
Chol/HDL Ratio: 3.9 ratio (ref 0.0–4.4)
Cholesterol, Total: 234 mg/dL — ABNORMAL HIGH (ref 100–199)
HDL: 60 mg/dL (ref >39)
LDL Chol Calc (NIH): 155 mg/dL — ABNORMAL HIGH (ref 0–99)
Triglycerides: 107 mg/dL (ref 0–149)
VLDL Cholesterol Cal: 19 mg/dL (ref 5–40)

## 2023-08-27 LAB — HEPATITIS C ANTIBODY: Hep C Virus Ab: NONREACTIVE

## 2023-08-27 LAB — TSH: TSH: 2.19 u[IU]/mL (ref 0.450–4.500)

## 2023-08-27 MED ORDER — ROSUVASTATIN CALCIUM 5 MG PO TABS
5.0000 mg | ORAL_TABLET | Freq: Every day | ORAL | 1 refills | Status: DC
Start: 2023-08-27 — End: 2024-01-13

## 2023-08-27 NOTE — Telephone Encounter (Signed)
Pt. Given lab results, states she would like to start medication for her cholesterol. Please advise pt.

## 2023-08-27 NOTE — Progress Notes (Signed)
Pt would like to start cholesterol medication.  KP

## 2023-08-27 NOTE — Telephone Encounter (Signed)
Message sent to Dr. Judithann Graves.  KP

## 2023-09-15 ENCOUNTER — Telehealth: Payer: Self-pay | Admitting: Pulmonary Disease

## 2023-09-15 MED ORDER — TRELEGY ELLIPTA 200-62.5-25 MCG/ACT IN AEPB: 1.0000 | INHALATION_SPRAY | Freq: Every day | RESPIRATORY_TRACT | 10 refills | Status: DC

## 2023-09-15 NOTE — Telephone Encounter (Signed)
Trelegy sent to preferred pharmacy.  Left detailed message for patient.  Nothing further needed.

## 2023-09-15 NOTE — Telephone Encounter (Signed)
Patient states needs refill for Trelegy. Pharmacy is [facility] [place]. Patient phone number is [phone].

## 2023-10-27 ENCOUNTER — Encounter: Payer: Self-pay | Admitting: Pulmonary Disease

## 2023-10-27 ENCOUNTER — Ambulatory Visit (INDEPENDENT_AMBULATORY_CARE_PROVIDER_SITE_OTHER): Payer: No Typology Code available for payment source | Admitting: Pulmonary Disease

## 2023-10-27 VITALS — BP 118/78 | HR 89 | Temp 99.1°F | Ht 62.0 in | Wt 139.2 lb

## 2023-10-27 DIAGNOSIS — J209 Acute bronchitis, unspecified: Secondary | ICD-10-CM

## 2023-10-27 DIAGNOSIS — F17210 Nicotine dependence, cigarettes, uncomplicated: Secondary | ICD-10-CM | POA: Diagnosis not present

## 2023-10-27 DIAGNOSIS — G4736 Sleep related hypoventilation in conditions classified elsewhere: Secondary | ICD-10-CM

## 2023-10-27 DIAGNOSIS — Z23 Encounter for immunization: Secondary | ICD-10-CM

## 2023-10-27 DIAGNOSIS — J449 Chronic obstructive pulmonary disease, unspecified: Secondary | ICD-10-CM | POA: Diagnosis not present

## 2023-10-27 DIAGNOSIS — J439 Emphysema, unspecified: Secondary | ICD-10-CM | POA: Diagnosis not present

## 2023-10-27 DIAGNOSIS — J440 Chronic obstructive pulmonary disease with acute lower respiratory infection: Secondary | ICD-10-CM | POA: Diagnosis not present

## 2023-10-27 MED ORDER — AZITHROMYCIN 250 MG PO TABS: ORAL_TABLET | ORAL | 0 refills | Status: AC

## 2023-10-27 MED ORDER — ALBUTEROL SULFATE HFA 108 (90 BASE) MCG/ACT IN AERS: 2.0000 | INHALATION_SPRAY | Freq: Four times a day (QID) | RESPIRATORY_TRACT | 6 refills | Status: DC | PRN

## 2023-10-27 MED ORDER — ALBUTEROL SULFATE (2.5 MG/3ML) 0.083% IN NEBU: 2.5000 mg | INHALATION_SOLUTION | Freq: Four times a day (QID) | RESPIRATORY_TRACT | 12 refills | Status: AC | PRN

## 2023-10-27 NOTE — Progress Notes (Signed)
Subjective:    Patient ID: Lisa Avila, female    DOB: 1956/07/23, 67 y.o.   MRN: [id number]  Patient Care Team: Reubin Milan, MD as PCP - General (Internal Medicine) Salena Saner, MD as Consulting Physician (Pulmonary Disease)  Chief Complaint  Patient presents with   Follow-up    DOE. Wheezing. Cough with green sputum.    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  The patient was last seen here on 15 July 2023.  This is a scheduled visit.  Not had any major episodes or exacerbations since her last visit.  HPI Discussed the use of AI scribe software for clinical note transcription with the patient, who gave verbal consent to proceed.  History of Present Illness   The patient, a 67 year old with severe stage three COPD and a history of smoking, presents for a follow-up visit. She reports that she has been managing her symptoms by pacing her activities, such as painting her house and mowing her lawn, with frequent breaks. However, she has noticed an increase in coughing and production of green sputum over the past couple of weeks. She attributes this to increased physical activity and possible exposure to paint fumes.  The patient has been using nighttime oxygen therapy and Trelegy, both of which she believes to be beneficial. She reports running out of albuterol, both the MDI and nebulizer, last week and has been taking it easy since then. Despite constant reminders to herself about the need to quit smoking, she continues to smoke at least a pack a day.  The patient has not experienced any fevers or chills. She is due for a repeat chest CT as part of her lung cancer screening, following a previous scan that showed possible inflammation and a mucus plug. She is also due for her annual flu shot.     DATA 12/05/2019 PFT: FEV1 0.65 L or 28%  predicted, FVC 1.58 L or 52% predicted, FEV1/FVC 41%. Postbronchodilator FEV1 with 20% no change and FVC with 14% no change indicating a degree of airway obstruction reversibility.  There is air trapping noted.  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 12/25/2019 2D echo: LVEF 60 to 65%.  Borderline LVH.  Abnormalities.  Mildly elevated pulmonary artery systolic pressure.  RV normal. 05/22/2020 low-dose chest CT: Multiple small lung nodules, benign in appearance, diffuse bronchial wall thickening and centrilobular and paraseptal emphysema 07/18/2021 low-dose chest CT: Multiple small lung nodules that are benign in appearance again noted.  Some reduced in size others stable.  Centrilobular emphysema again noted.  Follow-up in a year. 09/25/2021 PFTs: FEV1 0.71 L or 31% predicted, FVC 1.64 L or 55% predicted, FEV1/FVC 43%, lung volumes showed air trapping, no significant bronchodilator response.  Diffusion capacity severely reduced. 07/20/2022 LDCT: Lung RADS 2, moderate centrilobular emphysema, isolated right middle lobe pulmonary nodule 3 mm in size, mixed attenuation nodule on the prior exam has resolved. 07/13/2023 PFTs: FEV1 is 0.61 L or 27% predicted, FVC 1.60 L or 54% predicted, FEV1/FVC 38%, lung volumes show air trapping, diffusion capacity is severely impaired.  Compared to prior study of 25 September 2021 there has been further decline in FEV1. 07/22/2023 dose chest CT: Lung RADS 3 probably benign findings.  Short-term follow-up in 6 months recommended.  Multiple pulmonary nodules without visible growth.  New  left upper lobe nodule measuring 4.3 mm favor mucous impaction.  Review of Systems A 10 point review of systems was performed and it is as noted above otherwise negative.   Patient Active Problem List   Diagnosis Date Noted   Mixed hyperlipidemia 08/27/2023   Adenomatous polyp of colon  05/25/2023   Restless leg syndrome 05/10/2023   Major depression single episode, in partial remission (HCC) 05/10/2023   Nocturnal hypoxemia due to emphysema (HCC) 05/10/2023   COPD GOLD C 02/26/2014   Severe tobacco use disorder 02/26/2014    Social History   Tobacco Use   Smoking status: Every Day    Current packs/day: 1.50    Average packs/day: 1.5 packs/day for 50.0 years (75.0 ttl pk-yrs)    Types: Cigarettes   Smokeless tobacco: Never   Tobacco comments:    1 PPD- 10/27/2023 khj  Substance Use Topics   Alcohol use: No    Alcohol/week: 0.0 standard drinks of alcohol    No Known Allergies  Current Meds  Medication Sig   albuterol (PROVENTIL HFA;VENTOLIN HFA) 108 (90 BASE) MCG/ACT inhaler Inhale into the lungs every 6 (six) hours as needed for wheezing or shortness of breath.   cyclobenzaprine (FLEXERIL) 10 MG tablet Take 10 mg by mouth 3 (three) times daily as needed.   esomeprazole (NEXIUM) 20 MG capsule Take 20 mg by mouth daily at 12 noon.   OXYGEN Inhale 2.5 L into the lungs at bedtime.   rOPINIRole (REQUIP) 0.5 MG tablet Take 0.5 tablets by mouth as needed.   rosuvastatin (CRESTOR) 5 MG tablet Take 1 tablet (5 mg total) by mouth daily.   sertraline (ZOLOFT) 50 MG tablet Take 50 mg by mouth daily.   Spacer/Aero-Holding Chambers (AEROCHAMBER MV) inhaler Use as instructed to aid with inhaled medications.   traMADol (ULTRAM) 50 MG tablet Take 1 tablet (50 mg total) by mouth every 6 (six) hours as needed.   TRELEGY ELLIPTA 200-62.5-25 MCG/ACT AEPB Inhale 1 puff into the lungs daily.    Immunization History  Administered Date(s) Administered   Influenza Split 10/29/2013, 11/13/2014   Influenza,inj,Quad PF,6+ Mos 10/03/2018, 10/15/2020, 10/08/2022   Influenza-Unspecified 10/09/2019, 10/22/2021   PFIZER(Purple Top)SARS-COV-2 Vaccination 07/13/2020, 08/03/2020   PNEUMOCOCCAL CONJUGATE-20 08/26/2023   Pneumococcal Conjugate-13 11/13/2014   Tdap 08/11/2016         Objective:     BP 118/78 (BP Location: Right Arm, Cuff Size: Normal)   Pulse 89   Temp 99.1 F (37.3 C)   Ht 5\' 2"  (1.575 m)   Wt 139 lb 3.2 oz (63.1 kg)   SpO2 94%   BMI 25.46 kg/m   SpO2: 94 % O2 Device: None (Room air)  GENERAL: Well-developed well-nourished woman, no acute distress.  She is fully ambulatory.  No conversational dyspnea.  Non toxic appearing. HEAD: Normocephalic, atraumatic. EYES: Pupils equal, round, reactive to light.  No scleral icterus. MOUTH: Dentures, upper or lower no thrush.  Oral mucosa moist. NECK: Supple. No thyromegaly. Trachea midline. No JVD.  No adenopathy. PULMONARY: Good air entry bilaterally.  Coarse breath sounds otherwise, no adventitious sounds. CARDIOVASCULAR: S1 and S2. Regular rate and rhythm.  No rubs, murmurs or gallops heard. ABDOMEN: Benign. MUSCULOSKELETAL: Kyphosis noted, no clubbing, no edema. NEUROLOGIC: No focal deficit, no gait disturbance, speech is fluent. SKIN: Intact,warm,dry. PSYCH: Mood and behavior normal.  Assessment & Plan:     ICD-10-CM   1. Stage 3 severe COPD by GOLD classification (HCC)  J44.9     2. Nocturnal hypoxemia  due to emphysema (HCC)  J43.9    G47.36     3. Tobacco dependence due to cigarettes  F17.210     4. Need for immunization against influenza  Z23      Orders Placed This Encounter  Procedures   Flu Vaccine Trivalent High Dose (Fluad)   Meds ordered this encounter  Medications   azithromycin (ZITHROMAX) 250 MG tablet    Sig: Take 2 tablets (500 mg) on  Day 1,  followed by 1 tablet (250 mg) once daily on Days 2 through 5.    Dispense:  6 each    Refill:  0   albuterol (VENTOLIN HFA) 108 (90 Base) MCG/ACT inhaler    Sig: Inhale 2 puffs into the lungs every 6 (six) hours as needed for wheezing or shortness of breath.    Dispense:  8 g    Refill:  6   albuterol (PROVENTIL) (2.5 MG/3ML) 0.083% nebulizer solution    Sig: Take 3 mLs (2.5 mg total) by nebulization every 6 (six) hours as  needed for wheezing or shortness of breath.    Dispense:  75 mL    Refill:  12   Discussion:    COPD with acute bronchitis Increased cough and sputum production (green) likely secondary to bronchitis. No fever or chills. Lung sounds normal on auscultation. -Prescribe Azithromycin (Z-Pak) for possible bacterial infection. -Refill Albuterol inhaler and nebulizer. -Continue Trelegy Ellipta.  Tobacco Use Continued smoking at 1 pack per day. Discussed the importance of smoking cessation. -Encourage continued efforts to quit smoking.  Influenza Vaccination Eligible for high-dose vaccine and has not received this season's flu shot. -Administer high-dose influenza vaccine today. -She has not experienced fevers.  Lung Cancer Screening Due for repeat chest CT for follow-up of previous findings thought to be inflammation and mucus plug. -Schedule repeat chest CT.  Follow-up in 3 months or sooner if any problems arise.      Gailen Shelter, MD Advanced Bronchoscopy P[facility]    *This note was generated using voice recognition software/Dragon and/or AI transcription program.  Despite best efforts to proofread, errors can occur which can change the meaning. Any transcriptional errors that result from this process are unintentional and may not be fully corrected at the time of dictation.

## 2023-10-27 NOTE — Patient Instructions (Signed)
VISIT SUMMARY:  Today, we discussed your COPD symptoms, smoking habits, and preventive care measures. You mentioned an increase in coughing and green sputum, which we addressed. We also talked about your need for a repeat chest CT and flu vaccination.  YOUR PLAN:  -COPD EXACERBATION: COPD, or Chronic Obstructive Pulmonary Disease, is a lung condition that makes it hard to breathe. Your increased cough and green sputum suggest a possible bronchitis infection. We prescribed Azithromycin (Z-Pak) to treat this and refilled your Albuterol inhaler and nebulizer to help manage your symptoms.  -TOBACCO USE: Continued smoking can worsen your COPD and overall health. We discussed the importance of quitting smoking, and I encourage you to keep trying to quit.  -INFLUENZA VACCINATION: The flu shot helps protect you from the influenza virus. You are eligible for the high-dose vaccine, which we administered today.  -LUNG CANCER SCREENING: A chest CT scan helps [place] monitor your lung health. You are due for a repeat scan to follow up on previous findings of possible inflammation and a mucus plug. We will schedule this for you.  INSTRUCTIONS:  Please follow up in 3 months or sooner if any problems arise. Make sure to get your repeat chest CT scan as scheduled.

## 2023-11-10 DIAGNOSIS — Z9981 Dependence on supplemental oxygen: Secondary | ICD-10-CM | POA: Diagnosis not present

## 2023-11-10 DIAGNOSIS — F17210 Nicotine dependence, cigarettes, uncomplicated: Secondary | ICD-10-CM | POA: Diagnosis not present

## 2023-11-10 DIAGNOSIS — I700 Atherosclerosis of aorta: Secondary | ICD-10-CM | POA: Diagnosis not present

## 2023-11-10 DIAGNOSIS — Z008 Encounter for other general examination: Secondary | ICD-10-CM | POA: Diagnosis not present

## 2023-11-10 DIAGNOSIS — E785 Hyperlipidemia, unspecified: Secondary | ICD-10-CM | POA: Diagnosis not present

## 2023-11-10 DIAGNOSIS — J449 Chronic obstructive pulmonary disease, unspecified: Secondary | ICD-10-CM | POA: Diagnosis not present

## 2023-11-10 DIAGNOSIS — F3341 Major depressive disorder, recurrent, in partial remission: Secondary | ICD-10-CM | POA: Diagnosis not present

## 2023-11-10 DIAGNOSIS — Z6825 Body mass index (BMI) 25.0-25.9, adult: Secondary | ICD-10-CM | POA: Diagnosis not present

## 2024-01-13 ENCOUNTER — Encounter: Payer: Self-pay | Admitting: Internal Medicine

## 2024-01-13 ENCOUNTER — Ambulatory Visit (INDEPENDENT_AMBULATORY_CARE_PROVIDER_SITE_OTHER): Payer: No Typology Code available for payment source | Admitting: Internal Medicine

## 2024-01-13 VITALS — BP 124/78 | HR 82 | Ht 62.0 in | Wt 140.8 lb

## 2024-01-13 DIAGNOSIS — J449 Chronic obstructive pulmonary disease, unspecified: Secondary | ICD-10-CM

## 2024-01-13 DIAGNOSIS — F17200 Nicotine dependence, unspecified, uncomplicated: Secondary | ICD-10-CM | POA: Diagnosis not present

## 2024-01-13 DIAGNOSIS — E782 Mixed hyperlipidemia: Secondary | ICD-10-CM

## 2024-01-13 MED ORDER — ROSUVASTATIN CALCIUM 5 MG PO TABS: 5.0000 mg | ORAL_TABLET | Freq: Every day | ORAL | 1 refills | Status: DC

## 2024-01-13 MED ORDER — TRAMADOL HCL 50 MG PO TABS: 50.0000 mg | ORAL_TABLET | Freq: Every day | ORAL | 0 refills | Status: DC

## 2024-01-13 NOTE — Progress Notes (Signed)
Date:  01/13/2024   Name:  Lisa Avila   DOB:  20-Jan-1956   MRN:  [id number]   Chief Complaint: Hyperlipidemia  Hyperlipidemia This is a chronic problem. Associated symptoms include shortness of breath. Pertinent negatives include no chest pain. Current antihyperlipidemic treatment includes statins (started last visit).  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  She is on nebulizer albuterol and Trelegy daily. She has a chronic cough, esp at night for which she take tramadol at bedtime.  Review of Systems  Constitutional:  Negative for chills, fatigue and fever.  HENT:  Negative for trouble swallowing.   Respiratory:  Positive for cough, shortness of breath and wheezing. Negative for chest tightness.   Cardiovascular:  Negative for chest pain and palpitations.  Gastrointestinal:  Negative for abdominal pain, diarrhea and nausea.  Neurological:  Negative for dizziness and headaches.  Psychiatric/Behavioral:  Negative for dysphoric mood and sleep disturbance. The patient is not nervous/anxious.      Lab Results  Component Value Date   NA 137 08/26/2023   K 4.4 08/26/2023   CO2 27 08/26/2023   GLUCOSE 89 08/26/2023   BUN 7 (L) 08/26/2023   CREATININE 0.83 08/26/2023   CALCIUM 9.5 08/26/2023   EGFR 78 08/26/2023   Lab Results  Component Value Date   CHOL 234 (H) 08/26/2023   HDL 60 08/26/2023   LDLCALC 155 (H) 08/26/2023   TRIG 107 08/26/2023   CHOLHDL 3.9 08/26/2023   Lab Results  Component Value Date   TSH 2.190 08/26/2023   No results found for: "HGBA1C" Lab Results  Component Value Date   WBC 6.2 08/26/2023   HGB 15.0 08/26/2023   HCT 46.8 (H) 08/26/2023   MCV 93 08/26/2023   PLT 243 08/26/2023   Lab Results  Component Value Date   ALT 15 08/26/2023   AST 17 08/26/2023   ALKPHOS 108 08/26/2023   BILITOT 0.3 08/26/2023   No results found for: "25OHVITD2", "25OHVITD3", "VD25OH"   Patient Active Problem List   Diagnosis Date Noted   Mixed hyperlipidemia  08/27/2023   Adenomatous polyp of colon 05/25/2023   Restless leg syndrome 05/10/2023   Major depression single episode, in partial remission (HCC) 05/10/2023   Nocturnal hypoxemia due to emphysema (HCC) 05/10/2023   COPD GOLD C 02/26/2014   Severe tobacco use disorder 02/26/2014    No Known Allergies  Past Surgical History:  Procedure Laterality Date   CHOLECYSTECTOMY  2010   COLONOSCOPY WITH PROPOFOL N/A 05/25/2023   Procedure: COLONOSCOPY WITH BIOPSY;  Surgeon: Toney Reil, MD;  Location: [facility];  Service: Endoscopy;  Laterality: N/A;   POLYPECTOMY  05/25/2023   Procedure: POLYPECTOMY;  Surgeon: Toney Reil, MD;  Location: [facility];  Service: Endoscopy;;  CLIP X 1 PLACED AT ASCENDING COLON POLYP REMOVAL SITE   TUBAL LIGATION  1983    Social History   Tobacco Use   Smoking status: Every Day    Current packs/day: 1.50    Average packs/day: 1.5 packs/day for 50.0 years (75.0 ttl pk-yrs)    Types: Cigarettes   Smokeless tobacco: Never   Tobacco comments:    1 PPD- 10/27/2023 khj  Vaping Use   Vaping status: Never Used  Substance Use Topics   Alcohol use: No    Alcohol/week: 0.0 standard drinks of alcohol   Drug use: No     Medication list has been reviewed and updated.  Current Meds  Medication Sig   albuterol (  PROVENTIL) (2.5 MG/3ML) 0.083% nebulizer solution Take 3 mLs (2.5 mg total) by nebulization every 6 (six) hours as needed for wheezing or shortness of breath.   albuterol (VENTOLIN HFA) 108 (90 Base) MCG/ACT inhaler Inhale 2 puffs into the lungs every 6 (six) hours as needed for wheezing or shortness of breath.   cyclobenzaprine (FLEXERIL) 10 MG tablet Take 10 mg by mouth 3 (three) times daily as needed.   esomeprazole (NEXIUM) 20 MG capsule Take 20 mg by mouth daily at 12 noon.   OXYGEN Inhale 2.5 L into the lungs at bedtime.   rOPINIRole (REQUIP) 0.5 MG tablet Take 0.5 tablets by mouth as needed.   sertraline (ZOLOFT) 50  MG tablet Take 50 mg by mouth daily.   Spacer/Aero-Holding Chambers (AEROCHAMBER MV) inhaler Use as instructed to aid with inhaled medications.   TRELEGY ELLIPTA 200-62.5-25 MCG/ACT AEPB Inhale 1 puff into the lungs daily.   [DISCONTINUED] rosuvastatin (CRESTOR) 5 MG tablet Take 1 tablet (5 mg total) by mouth daily.   [DISCONTINUED] traMADol (ULTRAM) 50 MG tablet Take 1 tablet (50 mg total) by mouth every 6 (six) hours as needed.       01/13/2024    9:38 AM 08/26/2023   11:03 AM 05/10/2023    2:23 PM  GAD 7 : Generalized Anxiety Score  Nervous, Anxious, on Edge 0 0 1  Control/stop worrying 0 0 1  Worry too much - different things 0 0 1  Trouble relaxing 0 0 0  Restless 0 0 0  Easily annoyed or irritable 0 0 0  Afraid - awful might happen 0 0 0  Total GAD 7 Score 0 0 3  Anxiety Difficulty Not difficult at all Not difficult at all Not difficult at all       01/13/2024    9:37 AM 08/26/2023   11:03 AM 05/10/2023    2:23 PM  Depression screen PHQ 2/9  Decreased Interest 0 0 0  Down, Depressed, Hopeless 0 0 0  PHQ - 2 Score 0 0 0  Altered sleeping 0 1 1  Tired, decreased energy 0 1 1  Change in appetite 0 0 1  Feeling bad or failure about yourself  0 0 0  Trouble concentrating 0 0 0  Moving slowly or fidgety/restless 0 0 0  Suicidal thoughts 0 0 0  PHQ-9 Score 0 2 3  Difficult doing work/chores Not difficult at all Not difficult at all Not difficult at all    BP Readings from Last 3 Encounters:  01/13/24 124/78  10/27/23 118/78  08/26/23 120/62    Physical Exam Vitals and nursing note reviewed.  Constitutional:      General: She is not in acute distress.    Appearance: Normal appearance. She is well-developed.  HENT:     Head: Normocephalic and atraumatic.  Neck:     Vascular: No carotid bruit.  Cardiovascular:     Rate and Rhythm: Normal rate and regular rhythm.  Pulmonary:     Effort: Pulmonary effort is normal. No respiratory distress.     Breath sounds:  Decreased air movement present. No wheezing or rhonchi.  Musculoskeletal:     Cervical back: Normal range of motion.  Lymphadenopathy:     Cervical: No cervical adenopathy.  Skin:    General: Skin is warm and dry.     Findings: No rash.  Neurological:     Mental Status: She is alert and oriented to person, place, and time.  Psychiatric:  Attention and Perception: Attention normal.        Mood and Affect: Mood normal.        Behavior: Behavior normal.     Wt Readings from Last 3 Encounters:  01/13/24 140 lb 12.8 oz (63.9 kg)  10/27/23 139 lb 3.2 oz (63.1 kg)  08/26/23 138 lb (62.6 kg)    BP 124/78   Pulse 82   Ht 5\' 2"  (1.575 m)   Wt 140 lb 12.8 oz (63.9 kg)   BMI 25.75 kg/m   Assessment and Plan:  Problem List Items Addressed This Visit       Unprioritized   COPD GOLD C   Seeing Pulmonary and has chronic cough She takes one tramadol at bedtime to reduce the cough      Severe tobacco use disorder   She is trying to cut back. Continues to see Pulmonary and has CT lung nodule follow up scheduled      Mixed hyperlipidemia - Primary   LDL is  Lab Results  Component Value Date   LDLCALC 155 (H) 08/26/2023   Current regimen is Crestor started last visit.  Tolerating medications well without issues.       Relevant Medications   rosuvastatin (CRESTOR) 5 MG tablet   Other Relevant Orders   Comprehensive metabolic panel   Lipid panel    No follow-ups on file.    Reubin Milan, MD [facility] [place]

## 2024-01-13 NOTE — Assessment & Plan Note (Signed)
She is trying to cut back. Continues to see Pulmonary and has CT lung nodule follow up scheduled

## 2024-01-13 NOTE — Assessment & Plan Note (Signed)
Seeing Pulmonary and has chronic cough She takes one tramadol at bedtime to reduce the cough

## 2024-01-13 NOTE — Assessment & Plan Note (Addendum)
LDL is  Lab Results  Component Value Date   LDLCALC 155 (H) 08/26/2023   Current regimen is Crestor started last visit.  Tolerating medications well without issues.

## 2024-01-14 ENCOUNTER — Encounter: Payer: Self-pay | Admitting: Internal Medicine

## 2024-01-14 LAB — COMPREHENSIVE METABOLIC PANEL
ALT: 15 [IU]/L (ref 0–32)
AST: 19 [IU]/L (ref 0–40)
Albumin: 4.5 g/dL (ref 3.9–4.9)
Alkaline Phosphatase: 96 [IU]/L (ref 44–121)
BUN/Creatinine Ratio: 12 (ref 12–28)
BUN: 10 mg/dL (ref 8–27)
Bilirubin Total: 0.2 mg/dL (ref 0.0–1.2)
CO2: 25 mmol/L (ref 20–29)
Calcium: 9.5 mg/dL (ref 8.7–10.3)
Chloride: 101 mmol/L (ref 96–106)
Creatinine, Ser: 0.83 mg/dL (ref 0.57–1.00)
Globulin, Total: 1.8 g/dL (ref 1.5–4.5)
Glucose: 126 mg/dL — ABNORMAL HIGH (ref 70–99)
Potassium: 4.1 mmol/L (ref 3.5–5.2)
Sodium: 142 mmol/L (ref 134–144)
Total Protein: 6.3 g/dL (ref 6.0–8.5)
eGFR: 78 mL/min/{1.73_m2} (ref >59)

## 2024-01-14 LAB — LIPID PANEL
Chol/HDL Ratio: 3 {ratio} (ref 0.0–4.4)
Cholesterol, Total: 177 mg/dL (ref 100–199)
HDL: 59 mg/dL (ref >39)
LDL Chol Calc (NIH): 101 mg/dL — ABNORMAL HIGH (ref 0–99)
Triglycerides: 95 mg/dL (ref 0–149)
VLDL Cholesterol Cal: 17 mg/dL (ref 5–40)

## 2024-01-27 ENCOUNTER — Encounter: Payer: Self-pay | Admitting: Pulmonary Disease

## 2024-01-27 ENCOUNTER — Ambulatory Visit (INDEPENDENT_AMBULATORY_CARE_PROVIDER_SITE_OTHER): Payer: No Typology Code available for payment source | Admitting: Pulmonary Disease

## 2024-01-27 VITALS — BP 124/80 | HR 95 | Temp 97.1°F | Ht 62.0 in | Wt 139.8 lb

## 2024-01-27 DIAGNOSIS — J449 Chronic obstructive pulmonary disease, unspecified: Secondary | ICD-10-CM

## 2024-01-27 DIAGNOSIS — F17210 Nicotine dependence, cigarettes, uncomplicated: Secondary | ICD-10-CM

## 2024-01-27 DIAGNOSIS — G4736 Sleep related hypoventilation in conditions classified elsewhere: Secondary | ICD-10-CM

## 2024-01-27 DIAGNOSIS — J439 Emphysema, unspecified: Secondary | ICD-10-CM

## 2024-01-27 NOTE — Progress Notes (Unsigned)
Subjective:    Patient ID: Lisa Avila, female    DOB: 1956-08-28, 68 y.o.   MRN: [id number]  Patient Care Team: Reubin Milan, MD as PCP - General (Internal Medicine) Salena Saner, MD as Consulting Physician (Pulmonary Disease)  Chief Complaint  Patient presents with   Follow-up    SOB off and on. Wheezing. Cough with green sputum.    BACKGROUND/INTERVAL:  HPI    Review of Systems A 10 point review of systems was performed and it is as noted above otherwise negative.   Patient Active Problem List   Diagnosis Date Noted   Mixed hyperlipidemia 08/27/2023   Adenomatous polyp of colon 05/25/2023   Restless leg syndrome 05/10/2023   Major depression single episode, in partial remission (HCC) 05/10/2023   Nocturnal hypoxemia due to emphysema (HCC) 05/10/2023   COPD GOLD C 02/26/2014   Severe tobacco use disorder 02/26/2014    Social History   Tobacco Use   Smoking status: Every Day    Current packs/day: 1.00    Average packs/day: 1 pack/day for 50.0 years (50.0 ttl pk-yrs)    Types: Cigarettes   Smokeless tobacco: Never   Tobacco comments:    1PPD khj 01/27/2024    Started smoking at 68 years old    Smoked 2 PPD at her heaviest  Substance Use Topics   Alcohol use: No    Alcohol/week: 0.0 standard drinks of alcohol    No Known Allergies  Current Meds  Medication Sig   albuterol (PROVENTIL) (2.5 MG/3ML) 0.083% nebulizer solution Take 3 mLs (2.5 mg total) by nebulization every 6 (six) hours as needed for wheezing or shortness of breath.   albuterol (VENTOLIN HFA) 108 (90 Base) MCG/ACT inhaler Inhale 2 puffs into the lungs every 6 (six) hours as needed for wheezing or shortness of breath.   cyclobenzaprine (FLEXERIL) 10 MG tablet Take 10 mg by mouth 3 (three) times daily as needed.   esomeprazole (NEXIUM) 20 MG capsule Take 20 mg by mouth daily at 12 noon.   OXYGEN Inhale 2.5 L into the lungs at bedtime.   rOPINIRole (REQUIP) 0.5 MG tablet Take 0.5  tablets by mouth as needed.   rosuvastatin (CRESTOR) 5 MG tablet Take 1 tablet (5 mg total) by mouth daily.   sertraline (ZOLOFT) 50 MG tablet Take 50 mg by mouth daily.   Spacer/Aero-Holding Chambers (AEROCHAMBER MV) inhaler Use as instructed to aid with inhaled medications.   traMADol (ULTRAM) 50 MG tablet Take 1 tablet (50 mg total) by mouth at bedtime.   TRELEGY ELLIPTA 200-62.5-25 MCG/ACT AEPB Inhale 1 puff into the lungs daily.    Immunization History  Administered Date(s) Administered   Fluad Trivalent(High Dose 65+) 10/27/2023   Influenza Split 10/29/2013, 11/13/2014   Influenza,inj,Quad PF,6+ Mos 10/03/2018, 10/15/2020, 10/08/2022   Influenza-Unspecified 10/09/2019, 10/22/2021   PFIZER(Purple Top)SARS-COV-2 Vaccination 07/13/2020, 08/03/2020   PNEUMOCOCCAL CONJUGATE-20 08/26/2023   Pneumococcal Conjugate-13 11/13/2014   Tdap 08/11/2016        Objective:     BP 124/80 (BP Location: Right Arm, Cuff Size: Normal)   Pulse 95   Temp (!) 97.1 F (36.2 C)   Ht 5\' 2"  (1.575 m)   Wt 139 lb 12.8 oz (63.4 kg)   SpO2 96%   BMI 25.57 kg/m   SpO2: 96 % O2 Device: None (Room air)  GENERAL: HEAD: Normocephalic, atraumatic.  EYES: Pupils equal, round, reactive to light.  No scleral icterus.  MOUTH:  NECK: Supple. No thyromegaly. Trachea  midline. No JVD.  No adenopathy. PULMONARY: Good air entry bilaterally.  No adventitious sounds. CARDIOVASCULAR: S1 and S2. Regular rate and rhythm.  ABDOMEN: MUSCULOSKELETAL: No joint deformity, no clubbing, no edema.  NEUROLOGIC:  SKIN: Intact,warm,dry. PSYCH:        Assessment & Plan:   No diagnosis found.  No orders of the defined types were placed in this encounter.   No orders of the defined types were placed in this encounter.      Advised if symptoms do not improve or worsen, to please contact office for sooner follow up or seek emergency care.    I spent xxx minutes of dedicated to the care of this patient on the  date of this encounter to include pre-visit review of records, face-to-face time with the patient discussing conditions above, post visit ordering of testing, clinical documentation with the electronic health record, making appropriate referrals as documented, and communicating necessary findings to members of the patients care team.     C. Danice Goltz, MD Advanced Bronchoscopy [facility]    *This note was generated using voice recognition software/Dragon and/or AI transcription program.  Despite best efforts to proofread, errors can occur which can change the meaning. Any transcriptional errors that result from this process are unintentional and may not be fully corrected at the time of dictation.

## 2024-01-27 NOTE — Patient Instructions (Signed)
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 We reviewed your current treatment plan, including your use of Trelegy, rescue inhaler, and nebulizer. We also discussed your financial concerns regarding the cost of Trelegy and your ongoing efforts to quit smoking. Additionally, you are scheduled for a lung cancer screening CT in February.  YOUR PLAN:  -CHRONIC OBSTRUCTIVE PULMONARY DISEASE (COPD) WITH ASTHMA OVERLAP AND NOCTURNAL HYPOXEMIA: COPD with asthma overlap is a condition where symptoms of both COPD and asthma are present, and nocturnal hypoxemia means low oxygen levels at night. You should continue using Trelegy, your nebulizer as needed, and your nocturnal oxygen therapy. We will assist you with the [employer] application for financial assistance for Trelegy.  -SMOKING CESSATION: Smoking cessation is the process of quitting smoking. You are encouraged to continue using nicotine patches and to keep trying to quit smoking, as this will help improve your COPD and reduce the risk of exacerbations.  -GENERAL HEALTH MAINTENANCE: We have scheduled a lung cancer screening CT for February 22, 2024. Please continue your efforts to quit smoking as part of your general health maintenance.  INSTRUCTIONS:  Please follow up with [place] in three months for your next appointment. If you need assistance with the [employer] application for Trelegy financial assistance, do not hesitate to contact our office.

## 2024-02-07 ENCOUNTER — Encounter: Payer: Self-pay | Admitting: Pulmonary Disease

## 2024-02-22 ENCOUNTER — Ambulatory Visit
Admission: RE | Admit: 2024-02-22 | Discharge: 2024-02-22 | Disposition: A | Discharge status: Home / Self Care | Payer: No Typology Code available for payment source | Source: Ambulatory Visit | Attending: Acute Care | Admitting: Acute Care

## 2024-02-22 DIAGNOSIS — R911 Solitary pulmonary nodule: Secondary | ICD-10-CM | POA: Insufficient documentation

## 2024-02-22 DIAGNOSIS — F17210 Nicotine dependence, cigarettes, uncomplicated: Secondary | ICD-10-CM | POA: Diagnosis not present

## 2024-02-22 DIAGNOSIS — Z87891 Personal history of nicotine dependence: Secondary | ICD-10-CM | POA: Diagnosis not present

## 2024-03-06 ENCOUNTER — Encounter: Payer: Self-pay | Admitting: Pulmonary Disease

## 2024-03-06 DIAGNOSIS — R911 Solitary pulmonary nodule: Secondary | ICD-10-CM

## 2024-03-06 NOTE — Telephone Encounter (Signed)
 I think it has gotten larger, I will proceed with obtaining a PET/CT.

## 2024-03-06 NOTE — Telephone Encounter (Signed)
 This study was ordered through the lung cancer screening program.  The radiologist has not read it yet.  I see a new area that should be investigated further and recommend that we get a PET/CT.  If she is in agreement I can order this.  We can also move up her appointment from May to after the PET/CT is done.

## 2024-03-13 ENCOUNTER — Ambulatory Visit
Admission: RE | Admit: 2024-03-13 | Discharge: 2024-03-13 | Disposition: A | Discharge status: Home / Self Care | Source: Ambulatory Visit | Attending: Pulmonary Disease | Admitting: Pulmonary Disease

## 2024-03-13 DIAGNOSIS — I2510 Atherosclerotic heart disease of native coronary artery without angina pectoris: Secondary | ICD-10-CM | POA: Diagnosis not present

## 2024-03-13 DIAGNOSIS — I700 Atherosclerosis of aorta: Secondary | ICD-10-CM | POA: Insufficient documentation

## 2024-03-13 DIAGNOSIS — R918 Other nonspecific abnormal finding of lung field: Secondary | ICD-10-CM | POA: Diagnosis not present

## 2024-03-13 DIAGNOSIS — J439 Emphysema, unspecified: Secondary | ICD-10-CM | POA: Diagnosis not present

## 2024-03-13 DIAGNOSIS — R911 Solitary pulmonary nodule: Secondary | ICD-10-CM | POA: Diagnosis present

## 2024-03-13 DIAGNOSIS — I6523 Occlusion and stenosis of bilateral carotid arteries: Secondary | ICD-10-CM | POA: Diagnosis not present

## 2024-03-13 LAB — GLUCOSE, CAPILLARY: Glucose-Capillary: 81 mg/dL (ref 70–99)

## 2024-03-13 MED ORDER — FLUDEOXYGLUCOSE F - 18 (FDG) INJECTION
7.2900 | Freq: Once | INTRAVENOUS | Status: AC | PRN
Start: 2024-03-13 — End: 2024-03-13
  Administered 2024-03-13: 7.29 via INTRAVENOUS

## 2024-03-17 ENCOUNTER — Telehealth: Payer: Self-pay | Admitting: Acute Care

## 2024-03-17 NOTE — Telephone Encounter (Signed)
 Received call report 03/16/24 but call report did not have completed RADS reading.  Requested to update report with RADS notation.  As of today 03/17/24, report has not been updated.  Results are as follows:  IMPRESSION: 1. Scattered areas of new nodular consolidation in the lungs, possibly infectious/inflammatory in etiology. Malignancy cannot be excluded. Recommend follow-up low-dose lung cancer screening CT in 4-6 weeks from today's study date, after appropriate clinical therapy, as clinically indicated. These results will be called to the ordering clinician or representative by the Radiologist Assistant, and communication documented in the PACS or [employer]. 2. Aortic atherosclerosis (ICD10-I70.0). Coronary artery calcification. 3.  Emphysema (ICD10-J43.9). 4. Aortic atherosclerosis (ICD10-I70.0). Coronary artery calcification. 5.  Emphysema (ICD10-J43.9).

## 2024-03-28 ENCOUNTER — Ambulatory Visit (INDEPENDENT_AMBULATORY_CARE_PROVIDER_SITE_OTHER): Admitting: Pulmonary Disease

## 2024-03-28 ENCOUNTER — Other Ambulatory Visit
Admission: RE | Admit: 2024-03-28 | Discharge: 2024-03-28 | Disposition: A | Discharge status: Home / Self Care | Attending: Pulmonary Disease | Admitting: Pulmonary Disease

## 2024-03-28 ENCOUNTER — Encounter: Payer: Self-pay | Admitting: Pulmonary Disease

## 2024-03-28 VITALS — BP 120/82 | HR 89 | Temp 96.9°F | Ht 62.0 in | Wt 140.8 lb

## 2024-03-28 DIAGNOSIS — J479 Bronchiectasis, uncomplicated: Secondary | ICD-10-CM | POA: Diagnosis not present

## 2024-03-28 DIAGNOSIS — G4736 Sleep related hypoventilation in conditions classified elsewhere: Secondary | ICD-10-CM

## 2024-03-28 DIAGNOSIS — J439 Emphysema, unspecified: Secondary | ICD-10-CM | POA: Diagnosis not present

## 2024-03-28 DIAGNOSIS — J449 Chronic obstructive pulmonary disease, unspecified: Secondary | ICD-10-CM | POA: Diagnosis not present

## 2024-03-28 DIAGNOSIS — R918 Other nonspecific abnormal finding of lung field: Secondary | ICD-10-CM | POA: Diagnosis not present

## 2024-03-28 DIAGNOSIS — F17210 Nicotine dependence, cigarettes, uncomplicated: Secondary | ICD-10-CM | POA: Diagnosis not present

## 2024-03-28 LAB — EXPECTORATED SPUTUM ASSESSMENT W GRAM STAIN, RFLX TO RESP C

## 2024-03-28 MED ORDER — SODIUM CHLORIDE 0.9 % IN NEBU
3.0000 mL | INHALATION_SOLUTION | Freq: Once | RESPIRATORY_TRACT | Status: AC
  Administered 2024-03-28: 3 mL via RESPIRATORY_TRACT
  Filled 2024-03-28: qty 3

## 2024-03-28 MED ORDER — TRELEGY ELLIPTA 200-62.5-25 MCG/ACT IN AEPB: 1.0000 | INHALATION_SPRAY | Freq: Every day | RESPIRATORY_TRACT | 0 refills | Status: DC

## 2024-03-28 NOTE — Patient Instructions (Signed)
 VISIT SUMMARY:  During your visit, we discussed your persistent cough and reviewed the findings from your recent chest imaging. The cough has been ongoing, sometimes producing green sputum, and the imaging showed some changes in the size of the nodules in your lungs.  YOUR PLAN:  -PULMONARY INFECTION: You have a pulmonary infection, which means there is an infection in your lungs. This is likely due to a type of bacteria called Mycobacterium avium complex (MAC), which is related to tuberculosis but not the same. The changes in the size of the nodules in your lungs suggest an infection rather than cancer. We will collect a sputum sample to analyze for mycobacteria and schedule a follow-up appointment in 3 to 4 weeks to review the results and reassess your condition.  INSTRUCTIONS:  Please provide a sputum sample as instructed for mycobacteria analysis. We will schedule a follow-up appointment in 3 to 4 weeks to review the test results and reassess your condition.

## 2024-03-28 NOTE — Progress Notes (Signed)
 Pt given Saline neb for sputum induction.Pt produced small amount of light yellow thick sputum. Specimen sent to lab for analysis.

## 2024-03-28 NOTE — Progress Notes (Signed)
 Subjective:    Patient ID: Lisa Avila, female    DOB: 1956-12-18, 68 y.o.   MRN: [id number]  Patient Care Team: Reubin Milan, MD as PCP - General (Internal Medicine) Salena Saner, MD as Consulting Physician (Pulmonary Disease)  Chief Complaint  Patient presents with   Follow-up    SOB. No wheezing. Cough with greenish.     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  She also has multiple lung nodules.  The patient was last seen here on 27 January 2024.  She follows after PET/CT was obtained 13 March 2024, radiology has not interpreted that study yet.  HPI Discussed the use of AI scribe software for clinical note transcription with the patient, who gave verbal consent to proceed.  History of Present Illness   Lisa Avila is a 68 year old female who presents with a persistent cough and abnormal chest imaging findings.  She has been experiencing a persistent cough, which is mostly dry but occasionally productive of green sputum. This week, there has been a decrease in the amount of sputum produced. The cough has been ongoing, and a sputum sample is planned for further analysis.  A chest CT performed on February 25th revealed scattered lung nodules. A follow-up PET CT showed some areas that have changed in size, with some becoming smaller and others larger, raising concerns about a possible infection.  The nodules are FDG avid.  No skin lesions of concern, no significant changes in bowel habits. No issues with her genital health.  No prior history of cancer anywhere.  She continues to smoke 1 pack of cigarettes per day.  I reviewed the chest CT performed February 2025 as well as the recent PET/CT.  The PET/CT has not been interpreted yet this was obtained on 17 March.  I did provide the patient with my impressions and appropriate next  steps.     DATA 12/05/2019 PFT: FEV1 0.65 L or 28% predicted, FVC 1.58 L or 52% predicted, FEV1/FVC 41%. Postbronchodilator FEV1 with 20% no change and FVC with 14% no change indicating a degree of airway obstruction reversibility.  There is air trapping noted.  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 12/25/2019 2D echo: LVEF 60 to 65%.  Borderline LVH.  Abnormalities.  Mildly elevated pulmonary artery systolic pressure.  RV normal. 05/22/2020 low-dose chest CT: Multiple small lung nodules, benign in appearance, diffuse bronchial wall thickening and centrilobular and paraseptal emphysema 07/18/2021 low-dose chest CT: Multiple small lung nodules that are benign in appearance again noted.  Some reduced in size others stable.  Centrilobular emphysema again noted.  Follow-up in a year. 09/25/2021 PFTs: FEV1 0.71 L or 31% predicted, FVC 1.64 L or 55% predicted, FEV1/FVC 43%, lung volumes showed air trapping, no significant bronchodilator response.  Diffusion capacity severely reduced. 07/20/2022 LDCT: Lung RADS 2, moderate centrilobular emphysema, isolated right middle lobe pulmonary nodule 3 mm in size, mixed attenuation nodule on the prior exam has resolved. 07/13/2023 PFTs: FEV1 is 0.61 L or 27% predicted, FVC 1.60 L or 54% predicted, FEV1/FVC 38%, lung volumes show air trapping, diffusion capacity is severely impaired.  Compared to prior study of 25 September 2021 there has been further decline in FEV1. 07/22/2023 LDCT chest: Lung RADS 3 probably benign findings.  Short-term follow-up in 6 months  recommended.  Multiple pulmonary nodules without visible growth.  New left upper lobe nodule measuring 4.3 mm favor mucous impaction. 02/22/2024 LDCT chest: Scattered areas of new nodular consolidation in the lungs possibly infectious/inflammatory in etiology.  Malignancy cannot be excluded.  Recommend follow-up  low-dose lung cancer screening CT in 4 to 6 weeks after appropriate clinical therapy as clinically indicated.  Emphysema.  Aortic atherosclerosis. 03/13/2024 PET/CT: Independent review, there is area of uptake on multiple lung nodules some of the lung nodules appears to have regressed in size.  Still suggestive of potential infectious/inflammatory process, formal interpretation by radiology not available to date (04/01)  Review of Systems A 10 point review of systems was performed and it is as noted above otherwise negative.   Patient Active Problem List   Diagnosis Date Noted   Mixed hyperlipidemia 08/27/2023   Adenomatous polyp of colon 05/25/2023   Restless leg syndrome 05/10/2023   Major depression single episode, in partial remission (HCC) 05/10/2023   Nocturnal hypoxemia due to emphysema (HCC) 05/10/2023   COPD GOLD C 02/26/2014   Severe tobacco use disorder 02/26/2014    Social History   Tobacco Use   Smoking status: Every Day    Current packs/day: 1.00    Average packs/day: 1 pack/day for 50.0 years (50.0 ttl pk-yrs)    Types: Cigarettes   Smokeless tobacco: Never   Tobacco comments:    1PPD khj 03/28/2024    Started smoking at 68 years old    Smoked 2 PPD at her heaviest  Substance Use Topics   Alcohol use: No    Alcohol/week: 0.0 standard drinks of alcohol    No Known Allergies  Current Meds  Medication Sig   albuterol (PROVENTIL) (2.5 MG/3ML) 0.083% nebulizer solution Take 3 mLs (2.5 mg total) by nebulization every 6 (six) hours as needed for wheezing or shortness of breath.   albuterol (VENTOLIN HFA) 108 (90 Base) MCG/ACT inhaler Inhale 2 puffs into the lungs every 6 (six) hours as needed for wheezing or shortness of breath.   cyclobenzaprine (FLEXERIL) 10 MG tablet Take 10 mg by mouth 3 (three) times daily as needed.   esomeprazole (NEXIUM) 20 MG capsule Take 20 mg by mouth daily at 12 noon.   Fluticasone-Umeclidin-Vilant (TRELEGY ELLIPTA) 200-62.5-25 MCG/ACT AEPB  Inhale 1 puff into the lungs daily.   OXYGEN Inhale 2.5 L into the lungs at bedtime.   rOPINIRole (REQUIP) 0.5 MG tablet Take 0.5 tablets by mouth as needed.   rosuvastatin (CRESTOR) 5 MG tablet Take 1 tablet (5 mg total) by mouth daily.   sertraline (ZOLOFT) 50 MG tablet Take 50 mg by mouth daily.   Spacer/Aero-Holding Chambers (AEROCHAMBER MV) inhaler Use as instructed to aid with inhaled medications.   traMADol (ULTRAM) 50 MG tablet Take 1 tablet (50 mg total) by mouth at bedtime.   TRELEGY ELLIPTA 200-62.5-25 MCG/ACT AEPB Inhale 1 puff into the lungs daily.    Immunization History  Administered Date(s) Administered   Fluad Trivalent(High Dose 65+) 10/27/2023   Influenza Split 10/29/2013, 11/13/2014   Influenza,inj,Quad PF,6+ Mos 10/03/2018, 10/15/2020, 10/08/2022   Influenza-Unspecified 10/09/2019, 10/22/2021   PFIZER(Purple Top)SARS-COV-2 Vaccination 07/13/2020, 08/03/2020   PNEUMOCOCCAL CONJUGATE-20 08/26/2023   Pneumococcal Conjugate-13 11/13/2014   Tdap 08/11/2016        Objective:     BP 120/82 (BP Location: Right Arm, Cuff Size: Normal)   Pulse 89   Temp (!) 96.9 F (36.1 C)   Ht 5\' 2"  (1.575 m)   Wt 140 lb 12.8  oz (63.9 kg)   SpO2 94%   BMI 25.75 kg/m   SpO2: 94 % O2 Device: None (Room air)  GENERAL: Well-developed well-nourished woman, no acute distress.  She is fully ambulatory.  No conversational dyspnea.  Non toxic appearing. HEAD: Normocephalic, atraumatic. EYES: Pupils equal, round, reactive to light.  No scleral icterus. MOUTH: Dentures, upper or lower no thrush.  Oral mucosa moist. NECK: Supple. No thyromegaly. Trachea midline. No JVD.  No adenopathy. PULMONARY: Good air entry bilaterally.  Coarse breath sounds otherwise, no adventitious sounds. CARDIOVASCULAR: S1 and S2. Regular rate and rhythm.  No rubs, murmurs or gallops heard. ABDOMEN: Benign. MUSCULOSKELETAL: Kyphosis noted, no clubbing, no edema. NEUROLOGIC: No focal deficit, no gait  disturbance, speech is fluent. SKIN: Intact,warm,dry. PSYCH: Mood and behavior normal.     Assessment & Plan:     ICD-10-CM   1. Multiple lung nodules on CT  R91.8 Sputum induction    Culture, sputum-assessment    AFB culture with smear    2. Stage 3 severe COPD by GOLD classification (HCC)  J44.9     3. Nocturnal hypoxemia due to emphysema (HCC)  J43.9    G47.36     4. Tobacco dependence due to cigarettes  F17.210      Orders Placed This Encounter  Procedures   Culture, sputum-assessment    Sputum for C& S routine    Standing Status:   Future    Number of Occurrences:   1    Expiration Date:   03/28/2025   AFB culture with smear    MAC    Standing Status:   Future    Number of Occurrences:   1    Expiration Date:   03/28/2025   Sputum induction    MAI and regular culture    Standing Status:   Future    Expiration Date:   03/28/2025    Scheduling Instructions:     Can use (DuoNeb or Albuterol).    Where should this test be performed?:   [facility]   Meds ordered this encounter  Medications   Fluticasone-Umeclidin-Vilant (TRELEGY ELLIPTA) 200-62.5-25 MCG/ACT AEPB    Sig: Inhale 1 puff into the lungs daily.    Dispense:  28 each    Refill:  0    Lot Number?:   [id number]    Expiration Date?:   04/27/2025    Quantity:   2   Discussion:    Multiple lung nodules suggestive of infection/inflammation Presents with a cough, sometimes productive of green sputum, and scattered pulmonary nodules on chest CT. PET CT shows some nodules have decreased in size, suggestive more of an infectious process rather than malignancy. Differential diagnosis includes Mycobacterium avium complex (MAC) infection. The nodules' reduction in size and pattern are not typical of cancer, which usually grows and does not decrease in size. Biopsy is not currently indicated due to the small size of the nodules and the requirement of general anesthesia for the procedure in the setting of severe COPD. -  Order induced sputum sample for mycobacteria analysis and general culture - Schedule follow-up appointment in 3 to 4 weeks to reassess and review test results     Advised if symptoms do not improve or worsen, to please contact office for sooner follow up or seek emergency care.    I spent 30 minutes of dedicated to the care of this patient on the date of this encounter to include pre-visit review of records, face-to-face time with the patient discussing  conditions above, post visit ordering of testing, clinical documentation with the electronic health record, making appropriate referrals as documented, and communicating necessary findings to members of the patients care team.   C. Danice Goltz, MD Advanced Bronchoscopy P[facility]    *This note was generated using voice recognition software/Dragon and/or AI transcription program.  Despite best efforts to proofread, errors can occur which can change the meaning. Any transcriptional errors that result from this process are unintentional and may not be fully corrected at the time of dictation.

## 2024-03-30 LAB — ACID FAST SMEAR (AFB, MYCOBACTERIA): Acid Fast Smear: NEGATIVE

## 2024-03-31 LAB — CULTURE, RESPIRATORY W GRAM STAIN

## 2024-04-04 ENCOUNTER — Other Ambulatory Visit: Payer: Self-pay | Admitting: Pulmonary Disease

## 2024-04-04 DIAGNOSIS — A488 Other specified bacterial diseases: Secondary | ICD-10-CM

## 2024-04-04 MED ORDER — CIPROFLOXACIN HCL 500 MG PO TABS: 500.0000 mg | ORAL_TABLET | Freq: Two times a day (BID) | ORAL | 0 refills | Status: AC

## 2024-04-04 NOTE — Progress Notes (Signed)
 Patient with growth of Serratia marcescens on induced sputum.  Will proceed with treatment with ciprofloxacin as Levaquin not on patient's formulary.  Serratia sensitivity to ciprofloxacin is excellent.  Gailen Shelter, MD Advanced Bronchoscopy PCCM [facility]

## 2024-04-13 ENCOUNTER — Encounter: Payer: Self-pay | Admitting: Internal Medicine

## 2024-04-13 NOTE — Telephone Encounter (Signed)
 Please review.  KP

## 2024-04-14 ENCOUNTER — Other Ambulatory Visit: Payer: Self-pay | Admitting: Internal Medicine

## 2024-04-14 DIAGNOSIS — J449 Chronic obstructive pulmonary disease, unspecified: Secondary | ICD-10-CM

## 2024-04-14 MED ORDER — TRAMADOL HCL 50 MG PO TABS
50.0000 mg | ORAL_TABLET | Freq: Every day | ORAL | 1 refills | Status: DC
Start: 2024-04-14 — End: 2024-09-22

## 2024-04-14 NOTE — Progress Notes (Unsigned)
 Date:  04/14/2024   Name:  Lisa Avila   DOB:  03-Feb-1956   MRN:  [id number]   Chief Complaint: No chief complaint on file.  HPI  Review of Systems   Lab Results  Component Value Date   NA 142 01/13/2024   K 4.1 01/13/2024   CO2 25 01/13/2024   GLUCOSE 126 (H) 01/13/2024   BUN 10 01/13/2024   CREATININE 0.83 01/13/2024   CALCIUM  9.5 01/13/2024   EGFR 78 01/13/2024   Lab Results  Component Value Date   CHOL 177 01/13/2024   HDL 59 01/13/2024   LDLCALC 101 (H) 01/13/2024   TRIG 95 01/13/2024   CHOLHDL 3.0 01/13/2024   Lab Results  Component Value Date   TSH 2.190 08/26/2023   No results found for: HGBA1C Lab Results  Component Value Date   WBC 6.2 08/26/2023   HGB 15.0 08/26/2023   HCT 46.8 (H) 08/26/2023   MCV 93 08/26/2023   PLT 243 08/26/2023   Lab Results  Component Value Date   ALT 15 01/13/2024   AST 19 01/13/2024   ALKPHOS 96 01/13/2024   BILITOT 0.2 01/13/2024   No results found for: MARIEN BOLLS, VD25OH   Patient Active Problem List   Diagnosis Date Noted   Mixed hyperlipidemia 08/27/2023   Adenomatous polyp of colon 05/25/2023   Restless leg syndrome 05/10/2023   Major depression single episode, in partial remission (HCC) 05/10/2023   Nocturnal hypoxemia due to emphysema (HCC) 05/10/2023   COPD GOLD C 02/26/2014   Severe tobacco use disorder 02/26/2014    No Known Allergies  Past Surgical History:  Procedure Laterality Date   CHOLECYSTECTOMY  2010   COLONOSCOPY WITH PROPOFOL  N/A 05/25/2023   Procedure: COLONOSCOPY WITH BIOPSY;  Surgeon: Unk Corinn Skiff, MD;  Location: [facility];  Service: Endoscopy;  Laterality: N/A;   POLYPECTOMY  05/25/2023   Procedure: POLYPECTOMY;  Surgeon: Unk Corinn Skiff, MD;  Location: [facility];  Service: Endoscopy;;  CLIP X 1 PLACED AT ASCENDING COLON POLYP REMOVAL SITE   TUBAL LIGATION  1983    Social History   Tobacco Use   Smoking status: Every Day     Current packs/day: 1.00    Average packs/day: 1 pack/day for 50.0 years (50.0 ttl pk-yrs)    Types: Cigarettes   Smokeless tobacco: Never   Tobacco comments:    1PPD khj 03/28/2024    Started smoking at 68 years old    Smoked 2 PPD at her heaviest  Vaping Use   Vaping status: Never Used  Substance Use Topics   Alcohol use: No    Alcohol/week: 0.0 standard drinks of alcohol   Drug use: No     Medication list has been reviewed and updated.  No outpatient medications have been marked as taking for the 04/14/24 encounter (Orders Only) with Justus Leita DEL, MD.       01/13/2024    9:38 AM 08/26/2023   11:03 AM 05/10/2023    2:23 PM  GAD 7 : Generalized Anxiety Score  Nervous, Anxious, on Edge 0 0 1  Control/stop worrying 0 0 1  Worry too much - different things 0 0 1  Trouble relaxing 0 0 0  Restless 0 0 0  Easily annoyed or irritable 0 0 0  Afraid - awful might happen 0 0 0  Total GAD 7 Score 0 0 3  Anxiety Difficulty Not difficult at all Not difficult at all Not difficult at  all       01/13/2024    9:37 AM 08/26/2023   11:03 AM 05/10/2023    2:23 PM  Depression screen PHQ 2/9  Decreased Interest 0 0 0  Down, Depressed, Hopeless 0 0 0  PHQ - 2 Score 0 0 0  Altered sleeping 0 1 1  Tired, decreased energy 0 1 1  Change in appetite 0 0 1  Feeling bad or failure about yourself  0 0 0  Trouble concentrating 0 0 0  Moving slowly or fidgety/restless 0 0 0  Suicidal thoughts 0 0 0  PHQ-9 Score 0 2 3  Difficult doing work/chores Not difficult at all Not difficult at all Not difficult at all    BP Readings from Last 3 Encounters:  03/28/24 120/82  01/27/24 124/80  01/13/24 124/78    Physical Exam  Wt Readings from Last 3 Encounters:  03/28/24 140 lb 12.8 oz (63.9 kg)  01/27/24 139 lb 12.8 oz (63.4 kg)  01/13/24 140 lb 12.8 oz (63.9 kg)    There were no vitals taken for this visit.  Assessment and Plan:  Problem List Items Addressed This Visit   None   No  follow-ups on file.    Leita HILARIO Adie, MD [facility] [place]

## 2024-05-02 DIAGNOSIS — E663 Overweight: Secondary | ICD-10-CM | POA: Diagnosis not present

## 2024-05-02 DIAGNOSIS — Z6825 Body mass index (BMI) 25.0-25.9, adult: Secondary | ICD-10-CM | POA: Diagnosis not present

## 2024-05-02 DIAGNOSIS — Z008 Encounter for other general examination: Secondary | ICD-10-CM | POA: Diagnosis not present

## 2024-05-02 DIAGNOSIS — Z9981 Dependence on supplemental oxygen: Secondary | ICD-10-CM | POA: Diagnosis not present

## 2024-05-02 DIAGNOSIS — F324 Major depressive disorder, single episode, in partial remission: Secondary | ICD-10-CM | POA: Diagnosis not present

## 2024-05-04 ENCOUNTER — Ambulatory Visit (INDEPENDENT_AMBULATORY_CARE_PROVIDER_SITE_OTHER): Payer: No Typology Code available for payment source | Admitting: Pulmonary Disease

## 2024-05-04 ENCOUNTER — Encounter: Payer: Self-pay | Admitting: Pulmonary Disease

## 2024-05-04 VITALS — BP 120/82 | HR 92 | Temp 97.6°F | Ht 62.0 in | Wt 139.2 lb

## 2024-05-04 DIAGNOSIS — J439 Emphysema, unspecified: Secondary | ICD-10-CM | POA: Diagnosis not present

## 2024-05-04 DIAGNOSIS — J449 Chronic obstructive pulmonary disease, unspecified: Secondary | ICD-10-CM

## 2024-05-04 DIAGNOSIS — R918 Other nonspecific abnormal finding of lung field: Secondary | ICD-10-CM

## 2024-05-04 DIAGNOSIS — F17210 Nicotine dependence, cigarettes, uncomplicated: Secondary | ICD-10-CM | POA: Diagnosis not present

## 2024-05-04 DIAGNOSIS — G4736 Sleep related hypoventilation in conditions classified elsewhere: Secondary | ICD-10-CM | POA: Diagnosis not present

## 2024-05-04 DIAGNOSIS — A488 Other specified bacterial diseases: Secondary | ICD-10-CM

## 2024-05-04 NOTE — Patient Instructions (Addendum)
 VISIT SUMMARY:  During your visit, we discussed your ongoing management of COPD and lung nodules. You shared your concerns about the cost of your current medication, Trelegy, and we talked about your recent respiratory infection and its impact on your symptoms. We also reviewed your use of oxygen  at night and your experiences with environmental triggers like pollen and rain.  YOUR PLAN:  -CHRONIC OBSTRUCTIVE PULMONARY DISEASE (COPD): COPD is a chronic lung condition that makes it hard to breathe. You will continue using the Trelegy inhaler as it is effective for you. We discussed the use of antihistamines like Allegra, Claritin, or Zyrtec to help manage your allergy symptoms. If your symptoms worsen, you may need a course of prednisone. Continue using oxygen  at night as you have been doing.  -LUNG NODULES: Lung nodules are small growths in the lungs that need to be monitored. We will repeat your imaging in June to check for any changes or resolution, especially considering your recent infection, which may have affected the previous scan.  INSTRUCTIONS:  Please continue using your Trelegy inhaler and nighttime oxygen  as discussed. Consider using antihistamines for your allergies. If your symptoms worsen, contact us  as you may need prednisone. We will schedule a follow-up scan in June to reassess your lung nodules.  We will see you in follow-up in 2 months time call sooner should any problems arise.

## 2024-05-04 NOTE — Progress Notes (Signed)
 Subjective:    Patient ID: Lisa Avila, female    DOB: 1956-09-04, 68 y.o.   MRN: [id number]  Patient Care Team: Sheron Dixons, MD as PCP - General (Internal Medicine) Marc Senior, MD as Consulting Physician (Pulmonary Disease)  Chief Complaint  Patient presents with   Follow-up    Cough. Shortness of breath on exertion. Wheezing.     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  She also has multiple lung nodules.  The patient was last seen here on 28 March 2024.  At that time she was being evaluated for multiple lung nodules.  PET/CT obtained 13 March 2024 was favoring infectious/inflammatory etiology for multiple lung nodules.  The patient subsequently grew Serratia out of the sputum and was treated.  She presents today after that therapy.  HPI Discussed the use of AI scribe software for clinical note transcription with the patient, who gave verbal consent to proceed.  History of Present Illness   SHERMA Avila is a 68 year old female with COPD and lung nodules who presents for follow-up.  She smokes approximately one pack of cigarettes per day and acknowledges the need to quit. She recently completed a course of antibiotics for a respiratory infection, which improved her symptoms, although she occasionally coughs up sputum however this is not discolored.  Previously she had had significant purulent sputum production.  As noted she had Serratia marcescens infection.  She completed therapy for this.  Her breathing is generally good but worsens after rain due to effect of humidity on her breathing.  She is currently using Trelegy for COPD management but is concerned about the cost, especially given her reliance on social security. Her insurance has informed her about potential changes to her medication regimen, but she prefers to continue with  Trelegy as it is effective. She uses oxygen  at night and reports doing well with it.  She experiences increased respiratory symptoms when exposed to pollen and after rain. She has tried Allegra for allergies, which seemed to help, but she has not continued it. Coughing occurs primarily in the mornings or late evenings, often after spending time outdoors.  She remains active, working in her yard despite experiencing shortness of breath. She uses a wagon with a seat to rest when needed. She is particular about maintaining her lawn and is considering purchasing a riding lawnmower to manage it herself.  Occasional wheezing and shortness of breath, particularly after rain or pollen exposure. No constant coughing, noting it occurs mainly in the mornings or evenings. She uses oxygen  at night and reports compliance with this treatment.   She has nocturnal hypoxemia due to emphysema and is compliant with her oxygen  supplementation nocturnally.  She notes benefit of the therapy.    DATA 12/05/2019 PFT: FEV1 0.65 L or 28% predicted, FVC 1.58 L or 52% predicted, FEV1/FVC 41%. Postbronchodilator FEV1 with 20% no change and FVC with 14% no change indicating a degree of airway obstruction reversibility.  There is air trapping noted.  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 12/25/2019 2D echo: LVEF 60 to 65%.  Borderline LVH.  Abnormalities.  Mildly elevated pulmonary artery systolic pressure.  RV normal. 05/22/2020 low-dose chest CT: Multiple small lung nodules, benign in appearance, diffuse bronchial wall thickening and centrilobular and paraseptal emphysema 07/18/2021 low-dose chest  CT: Multiple small lung nodules that are benign in appearance again noted.  Some reduced in size others stable.  Centrilobular emphysema again noted.  Follow-up in a year. 09/25/2021 PFTs: FEV1 0.71 L or 31% predicted, FVC 1.64 L or 55%  predicted, FEV1/FVC 43%, lung volumes showed air trapping, no significant bronchodilator response.  Diffusion capacity severely reduced. 07/20/2022 LDCT: Lung RADS 2, moderate centrilobular emphysema, isolated right middle lobe pulmonary nodule 3 mm in size, mixed attenuation nodule on the prior exam has resolved. 07/13/2023 PFTs: FEV1 is 0.61 L or 27% predicted, FVC 1.60 L or 54% predicted, FEV1/FVC 38%, lung volumes show air trapping, diffusion capacity is severely impaired.  Compared to prior study of 25 September 2021 there has been further decline in FEV1. 07/22/2023 LDCT chest: Lung RADS 3 probably benign findings.  Short-term follow-up in 6 months recommended.  Multiple pulmonary nodules without visible growth.  New left upper lobe nodule measuring 4.3 mm favor mucous impaction. 02/22/2024 LDCT chest: Scattered areas of new nodular consolidation in the lungs possibly infectious/inflammatory in etiology.  Malignancy cannot be excluded.  Recommend follow-up low-dose lung cancer screening CT in 4 to 6 weeks after appropriate clinical therapy as clinically indicated.  Emphysema.  Aortic atherosclerosis. 03/13/2024 PET/CT: Independent review, there is area of uptake on multiple lung nodules some of the lung nodules appears to have regressed in size.  Still suggestive of potential infectious/inflammatory process, formal interpretation by radiology confirms.   Review of Systems A 10 point review of systems was performed and it is as noted above otherwise negative.   Patient Active Problem List   Diagnosis Date Noted   Mixed hyperlipidemia 08/27/2023   Adenomatous polyp of colon 05/25/2023   Restless leg syndrome 05/10/2023   Major depression single episode, in partial remission (HCC) 05/10/2023   Nocturnal hypoxemia due to emphysema (HCC) 05/10/2023   COPD GOLD C 02/26/2014   Severe tobacco use disorder 02/26/2014    Social History   Tobacco Use   Smoking status: Every Day    Current  packs/day: 1.00    Average packs/day: 1 pack/day for 50.0 years (50.0 ttl pk-yrs)    Types: Cigarettes   Smokeless tobacco: Never   Tobacco comments:    1PPD khj 03/28/2024    Started smoking at 68 years old    Smoked 2 PPD at her heaviest  Substance Use Topics   Alcohol use: No    Alcohol/week: 0.0 standard drinks of alcohol    No Known Allergies  Current Meds  Medication Sig   albuterol  (PROVENTIL ) (2.5 MG/3ML) 0.083% nebulizer solution Take 3 mLs (2.5 mg total) by nebulization every 6 (six) hours as needed for wheezing or shortness of breath.   albuterol  (VENTOLIN  HFA) 108 (90 Base) MCG/ACT inhaler Inhale 2 puffs into the lungs every 6 (six) hours as needed for wheezing or shortness of breath.   cyclobenzaprine (FLEXERIL) 10 MG tablet Take 10 mg by mouth 3 (three) times daily as needed.   esomeprazole (NEXIUM) 20 MG capsule Take 20 mg by mouth daily at 12 noon.   OXYGEN  Inhale 2.5 L into the lungs at bedtime.   rOPINIRole (REQUIP) 0.5 MG tablet Take 0.5 tablets by mouth as needed.   rosuvastatin  (CRESTOR ) 5 MG tablet Take 1 tablet (5 mg total) by mouth daily.   sertraline (ZOLOFT) 50 MG tablet Take 50 mg by mouth daily.   Spacer/Aero-Holding Chambers (AEROCHAMBER MV) inhaler Use as instructed to aid with inhaled medications.   traMADol  (ULTRAM ) 50 MG tablet  Take 1 tablet (50 mg total) by mouth at bedtime.   TRELEGY ELLIPTA  200-62.5-25 MCG/ACT AEPB Inhale 1 puff into the lungs daily.    Immunization History  Administered Date(s) Administered   Fluad Trivalent(High Dose 65+) 10/27/2023   Influenza Split 10/29/2013, 11/13/2014   Influenza,inj,Quad PF,6+ Mos 10/03/2018, 10/15/2020, 10/08/2022   Influenza-Unspecified 10/09/2019, 10/22/2021   PFIZER(Purple Top)SARS-COV-2 Vaccination 07/13/2020, 08/03/2020   PNEUMOCOCCAL CONJUGATE-20 08/26/2023   Pneumococcal Conjugate-13 11/13/2014   Tdap 08/11/2016        Objective:     BP 120/82 (BP Location: Left Arm, Patient Position:  Sitting, Cuff Size: Normal)   Pulse 92   Temp 97.6 F (36.4 C) (Temporal)   Ht 5\' 2"  (1.575 m)   Wt 139 lb 3.2 oz (63.1 kg)   SpO2 98%   BMI 25.46 kg/m   SpO2: 98 %  GENERAL: Well-developed well-nourished woman, no acute distress.  She is fully ambulatory.  No conversational dyspnea.  Non toxic appearing. HEAD: Normocephalic, atraumatic. EYES: Pupils equal, round, reactive to light.  No scleral icterus. MOUTH: Dentures, upper or lower no thrush.  Oral mucosa moist. NECK: Supple. No thyromegaly. Trachea midline. No JVD.  No adenopathy. PULMONARY: Good air entry bilaterally.  Coarse breath sounds otherwise, no adventitious sounds. CARDIOVASCULAR: S1 and S2. Regular rate and rhythm.  No rubs, murmurs or gallops heard. ABDOMEN: Benign. MUSCULOSKELETAL: Kyphosis noted, no clubbing, no edema. NEUROLOGIC: No focal deficit, no gait disturbance, speech is fluent. SKIN: Intact,warm,dry. PSYCH: Mood and behavior normal.   Results for orders placed or performed during the hospital encounter of 03/28/24  Culture, sputum-assessment     Status: None   Collection Time: 03/28/24 12:00 PM   Specimen: INDUCED SPUTUM  Result Value Ref Range Status   Specimen Description INDUCED SPUTUM  Final   Special Requests NONE  Final   Sputum evaluation   Final    THIS SPECIMEN IS ACCEPTABLE FOR SPUTUM CULTURE Performed at [facility], [address] [place], [place] [address]    Report Status 03/28/2024 FINAL  Final  Acid Fast Smear (AFB)     Status: None   Collection Time: 03/28/24 12:00 PM   Specimen: Sputum  Result Value Ref Range Status   AFB Specimen Processing Concentration  Final   Acid Fast Smear Negative  Final    Comment: (NOTE) Performed At: [facility] [address] [place], [place] [id number] Pearlean Botts MD [id number]    Source (AFB) INDUCED SPUTUM  Final    Comment: Performed at [facility], [address] [place], [place] [address]  Acid Fast  Culture with reflexed sensitivities     Status: None   Collection Time: 03/28/24 12:00 PM   Specimen: Sputum  Result Value Ref Range Status   Acid Fast Culture Negative  Final    Comment: (NOTE) No acid fast bacilli isolated after 6 weeks. Performed At: [facility] [address] [place], [place] [id number] Pearlean Botts MD [id number]    Source of Sample INDUCED SPUTUM  Final    Comment: Performed at [facility], [address] [place], [place] [address]  Culture, Respiratory w Gram Stain     Status: None   Collection Time: 03/28/24 12:00 PM   Specimen: INDUCED SPUTUM  Result Value Ref Range Status   Specimen Description   Final    INDUCED SPUTUM Performed at [facility], [address] [place], [place] [address]    Special Requests   Final    NONE  Reflexed from [phone] Performed at [facility], [address] [place], [place] [address]    Gram Stain   Final    FEW WBC PRESENT,BOTH PMN AND MONONUCLEAR FEW GRAM POSITIVE COCCI IN PAIRS AND CHAINS Performed at [facility], [address] [place], [place] [address]    Culture MODERATE SERRATIA MARCESCENS  Final   Report Status 03/31/2024 FINAL  Final   Organism ID, Bacteria SERRATIA MARCESCENS  Final      Susceptibility   Serratia marcescens - MIC*    CEFEPIME 1 SENSITIVE Sensitive     CEFTAZIDIME 32 RESISTANT Resistant     CEFTRIAXONE >=64 RESISTANT Resistant     CIPROFLOXACIN  <=0.25 SENSITIVE Sensitive     GENTAMICIN <=1 SENSITIVE Sensitive     TRIMETH/SULFA <=20 SENSITIVE Sensitive     * MODERATE SERRATIA MARCESCENS   Patient was treated with ciprofloxacin .   Assessment & Plan:     ICD-10-CM   1. Stage 3 severe COPD by GOLD classification (HCC)  J44.9     2. Multiple lung nodules on CT  R91.8 CT CHEST WO CONTRAST    3. Serratia marcescens infection  A48.8     4. Nocturnal hypoxemia due to emphysema (HCC)  J43.9    G47.36     5. Tobacco dependence due to  cigarettes  F17.210       Orders Placed This Encounter  Procedures   CT CHEST WO CONTRAST    Standing Status:   Future    Expected Date:   06/04/2024    Expiration Date:   05/04/2025    Preferred imaging location?:   [facility]   Discussion:    Chronic Obstructive Pulmonary Disease (COPD) COPD with intermittent exacerbations. Currently using Trelegy inhaler, which is effective but costly. She prefers to continue with Trelegy due to its effectiveness despite potential insurance changes. Mild wheezing likely exacerbated by environmental factors such as pollen and humidity. Experiences increased symptoms after rain due to environmental allergens. Intermittent productive cough, more pronounced in the mornings and evenings, likely related to outdoor activities and environmental exposure. Discussed the use of antihistamines for allergy management. She uses oxygen  at night and is compliant with this therapy. - Continue Trelegy inhaler - Consider antihistamines such as Allegra, Claritin, or Zyrtec for allergy management - Prescribe prednisone if symptoms worsen - Continue nighttime oxygen  use  Lung Nodules Lung nodules previously identified. Recent PET CT performed on March 13, 2024. Plan to repeat imaging in June to assess for changes or resolution, particularly in light of recent infection with Serratia, which may have contributed to imaging findings. - Order follow-up scan in June to reassess lung nodules       Advised if symptoms do not improve or worsen, to please contact office for sooner follow up or seek emergency care.    I spent 31 minutes of dedicated to the care of this patient on the date of this encounter to include pre-visit review of records, face-to-face time with the patient discussing conditions above, post visit ordering of testing, clinical documentation with the electronic health record, making appropriate referrals as documented, and communicating necessary findings to  members of the patients care team.     C. Chloe Counter, MD Advanced Bronchoscopy PCCM [facility]    *This note was generated using voice recognition software/Dragon and/or AI transcription program.  Despite best efforts to proofread, errors can occur which can change the meaning. Any transcriptional errors that result from this process are unintentional  and may not be fully corrected at the time of dictation.

## 2024-05-09 ENCOUNTER — Telehealth: Payer: Self-pay

## 2024-05-09 NOTE — Telephone Encounter (Signed)
 Copied from CRM [id number]. Topic: Clinical - Medication Question >> May 09, 2024 12:16 PM [place] wrote: Reason for CRM: Cassandra with [facility]- patient is wanting switch from TRELEGY ELLIPTA  200-62.5-25 MCG/ACT AEPB to [facility] [place] [facility]- would also like to be prescribed Tizanidine for musculo-skeletal pain-  please call [phone]

## 2024-05-09 NOTE — Telephone Encounter (Signed)
 No answer to phone call, left VM message to have patient call back to discuss medications.

## 2024-05-10 NOTE — Telephone Encounter (Signed)
 Patient called back. Provided note from provider. Patient states she has reached out to pulmonology. She did not want to make an appt at this time. States she is fine to keep the one in September. Thank You

## 2024-05-10 NOTE — Telephone Encounter (Signed)
 Left VM message to have patient call back regarding medication.

## 2024-05-11 LAB — ACID FAST CULTURE WITH REFLEXED SENSITIVITIES (MYCOBACTERIA): Acid Fast Culture: NEGATIVE

## 2024-05-21 ENCOUNTER — Encounter: Payer: Self-pay | Admitting: Pulmonary Disease

## 2024-05-25 ENCOUNTER — Encounter: Payer: Self-pay | Admitting: Pulmonary Disease

## 2024-05-25 ENCOUNTER — Encounter: Payer: Self-pay | Admitting: Internal Medicine

## 2024-05-25 ENCOUNTER — Other Ambulatory Visit: Payer: Self-pay | Admitting: Internal Medicine

## 2024-05-25 DIAGNOSIS — F324 Major depressive disorder, single episode, in partial remission: Secondary | ICD-10-CM

## 2024-05-25 DIAGNOSIS — J209 Acute bronchitis, unspecified: Secondary | ICD-10-CM

## 2024-05-25 MED ORDER — SERTRALINE HCL 50 MG PO TABS: 50.0000 mg | ORAL_TABLET | Freq: Every day | ORAL | 1 refills | Status: DC

## 2024-05-25 MED ORDER — AZITHROMYCIN 250 MG PO TABS: ORAL_TABLET | ORAL | 0 refills | Status: AC

## 2024-05-25 MED ORDER — PREDNISONE 10 MG (21) PO TBPK: ORAL_TABLET | ORAL | 0 refills | Status: DC

## 2024-05-25 NOTE — Telephone Encounter (Signed)
 Sent prescriptions to pharmacy for prednisone and Z-Pak.  If her condition worsens in any way she will have to be seen at either urgent care or ED.

## 2024-05-25 NOTE — Progress Notes (Unsigned)
 Date:  05/25/2024   Name:  Lisa Avila   DOB:  10-25-1956   MRN:  [id number]   Chief Complaint: No chief complaint on file.  HPI  Review of Systems   Lab Results  Component Value Date   NA 142 01/13/2024   K 4.1 01/13/2024   CO2 25 01/13/2024   GLUCOSE 126 (H) 01/13/2024   BUN 10 01/13/2024   CREATININE 0.83 01/13/2024   CALCIUM  9.5 01/13/2024   EGFR 78 01/13/2024   Lab Results  Component Value Date   CHOL 177 01/13/2024   HDL 59 01/13/2024   LDLCALC 101 (H) 01/13/2024   TRIG 95 01/13/2024   CHOLHDL 3.0 01/13/2024   Lab Results  Component Value Date   TSH 2.190 08/26/2023   No results found for: "HGBA1C" Lab Results  Component Value Date   WBC 6.2 08/26/2023   HGB 15.0 08/26/2023   HCT 46.8 (H) 08/26/2023   MCV 93 08/26/2023   PLT 243 08/26/2023   Lab Results  Component Value Date   ALT 15 01/13/2024   AST 19 01/13/2024   ALKPHOS 96 01/13/2024   BILITOT 0.2 01/13/2024   No results found for: "25OHVITD2", "25OHVITD3", "VD25OH"   Patient Active Problem List   Diagnosis Date Noted   Mixed hyperlipidemia 08/27/2023   Adenomatous polyp of colon 05/25/2023   Restless leg syndrome 05/10/2023   Major depression single episode, in partial remission (HCC) 05/10/2023   Nocturnal hypoxemia due to emphysema (HCC) 05/10/2023   COPD GOLD C 02/26/2014   Severe tobacco use disorder 02/26/2014    No Known Allergies  Past Surgical History:  Procedure Laterality Date   CHOLECYSTECTOMY  2010   COLONOSCOPY WITH PROPOFOL  N/A 05/25/2023   Procedure: COLONOSCOPY WITH BIOPSY;  Surgeon: Selena Daily, MD;  Location: [facility];  Service: Endoscopy;  Laterality: N/A;   POLYPECTOMY  05/25/2023   Procedure: POLYPECTOMY;  Surgeon: Selena Daily, MD;  Location: [facility];  Service: Endoscopy;;  CLIP X 1 PLACED AT ASCENDING COLON POLYP REMOVAL SITE   TUBAL LIGATION  1983    Social History   Tobacco Use   Smoking status: Every Day     Current packs/day: 1.00    Average packs/day: 1 pack/day for 50.0 years (50.0 ttl pk-yrs)    Types: Cigarettes   Smokeless tobacco: Never   Tobacco comments:    1PPD khj 68 years old 03/28/2024    Started smoking at 68 years old    Smoked 2 PPD at her heaviest  Vaping Use   Vaping status: Never Used  Substance Use Topics   Alcohol use: No    Alcohol/week: 0.0 standard drinks of alcohol   Drug use: No     Medication list has been reviewed and updated.  No outpatient medications have been marked as taking for the 05/25/24 encounter (Orders Only) with Sheron Dixons, MD.       01/13/2024    9:38 AM 08/26/2023   11:03 AM 05/10/2023    2:23 PM  GAD 7 : Generalized Anxiety Score  Nervous, Anxious, on Edge 0 0 1  Control/stop worrying 0 0 1  Worry too much - different things 0 0 1  Trouble relaxing 0 0 0  Restless 0 0 0  Easily annoyed or irritable 0 0 0  Afraid - awful might happen 0 0 0  Total GAD 7 Score 0 0 3  Anxiety Difficulty Not difficult at all Not difficult at all Not difficult at  all       01/13/2024    9:37 AM 08/26/2023   11:03 AM 05/10/2023    2:23 PM  Depression screen PHQ 2/9  Decreased Interest 0 0 0  Down, Depressed, Hopeless 0 0 0  PHQ - 2 Score 0 0 0  Altered sleeping 0 1 1  Tired, decreased energy 0 1 1  Change in appetite 0 0 1  Feeling bad or failure about yourself  0 0 0  Trouble concentrating 0 0 0  Moving slowly or fidgety/restless 0 0 0  Suicidal thoughts 0 0 0  PHQ-9 Score 0 2 3  Difficult doing work/chores Not difficult at all Not difficult at all Not difficult at all    BP Readings from Last 3 Encounters:  05/04/24 120/82  03/28/24 120/82  01/27/24 124/80    Physical Exam  Wt Readings from Last 3 Encounters:  05/04/24 139 lb 3.2 oz (63.1 kg)  03/28/24 140 lb 12.8 oz (63.9 kg)  01/27/24 139 lb 12.8 oz (63.4 kg)    There were no vitals taken for this visit.  Assessment and Plan:  Problem List Items Addressed This Visit   None   No  follow-ups on file.    Sheron Dixons, MD [facility] [place]

## 2024-05-25 NOTE — Telephone Encounter (Signed)
Please review patients request .

## 2024-06-15 ENCOUNTER — Ambulatory Visit
Admission: RE | Admit: 2024-06-15 | Discharge: 2024-06-15 | Disposition: A | Discharge status: Home / Self Care | Source: Ambulatory Visit | Attending: Pulmonary Disease | Admitting: Pulmonary Disease

## 2024-06-15 DIAGNOSIS — I700 Atherosclerosis of aorta: Secondary | ICD-10-CM | POA: Diagnosis not present

## 2024-06-15 DIAGNOSIS — R918 Other nonspecific abnormal finding of lung field: Secondary | ICD-10-CM | POA: Insufficient documentation

## 2024-06-15 DIAGNOSIS — J432 Centrilobular emphysema: Secondary | ICD-10-CM | POA: Diagnosis not present

## 2024-06-22 ENCOUNTER — Ambulatory Visit (INDEPENDENT_AMBULATORY_CARE_PROVIDER_SITE_OTHER): Admitting: Pulmonary Disease

## 2024-06-22 ENCOUNTER — Encounter: Payer: Self-pay | Admitting: Pulmonary Disease

## 2024-06-22 VITALS — BP 110/70 | HR 94 | Temp 97.1°F | Ht 62.0 in | Wt 138.4 lb

## 2024-06-22 DIAGNOSIS — F17210 Nicotine dependence, cigarettes, uncomplicated: Secondary | ICD-10-CM | POA: Diagnosis not present

## 2024-06-22 DIAGNOSIS — J439 Emphysema, unspecified: Secondary | ICD-10-CM | POA: Diagnosis not present

## 2024-06-22 DIAGNOSIS — J441 Chronic obstructive pulmonary disease with (acute) exacerbation: Secondary | ICD-10-CM | POA: Diagnosis not present

## 2024-06-22 DIAGNOSIS — G4736 Sleep related hypoventilation in conditions classified elsewhere: Secondary | ICD-10-CM | POA: Diagnosis not present

## 2024-06-22 DIAGNOSIS — R918 Other nonspecific abnormal finding of lung field: Secondary | ICD-10-CM | POA: Diagnosis not present

## 2024-06-22 DIAGNOSIS — R062 Wheezing: Secondary | ICD-10-CM

## 2024-06-22 DIAGNOSIS — J449 Chronic obstructive pulmonary disease, unspecified: Secondary | ICD-10-CM | POA: Diagnosis not present

## 2024-06-22 DIAGNOSIS — A488 Other specified bacterial diseases: Secondary | ICD-10-CM | POA: Diagnosis not present

## 2024-06-22 DIAGNOSIS — J9801 Acute bronchospasm: Secondary | ICD-10-CM | POA: Diagnosis not present

## 2024-06-22 LAB — NITRIC OXIDE: Nitric Oxide: 6

## 2024-06-22 MED ORDER — BREZTRI AEROSPHERE 160-9-4.8 MCG/ACT IN AERO
2.0000 | INHALATION_SPRAY | Freq: Two times a day (BID) | RESPIRATORY_TRACT | Status: DC
Start: 2024-06-22 — End: 2024-08-29

## 2024-06-22 MED ORDER — PREDNISONE 10 MG (21) PO TBPK: ORAL_TABLET | ORAL | 0 refills | Status: DC

## 2024-06-22 MED ORDER — ALBUTEROL SULFATE (2.5 MG/3ML) 0.083% IN NEBU
2.5000 mg | INHALATION_SOLUTION | Freq: Once | RESPIRATORY_TRACT | Status: AC
  Administered 2024-06-22: 2.5 mg via RESPIRATORY_TRACT

## 2024-06-22 NOTE — Progress Notes (Signed)
 Subjective:    Patient ID: Lisa Avila, female    DOB: [birth year], 68 y.o.   MRN: [id number]  Patient Care Team: Justus Leita DEL, MD as PCP - General (Internal Medicine) Tamea Dedra CROME, MD as Consulting Physician (Pulmonary Disease)  Chief Complaint  Patient presents with   Follow-up    SOB. Occasional cough with light green/yellow sputum.     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  She also has multiple lung nodules.  The patient was last seen here on 04 May 2024.  She has been evaluated for multiple lung nodules.  PET/CT obtained 13 March 2024 was favoring infectious/inflammatory etiology for multiple lung nodules.  The patient subsequently grew Serratia out of the sputum and was treated.  This is a scheduled follow-up visit.  HPI Discussed the use of AI scribe software for clinical note transcription with the patient, who gave verbal consent to proceed.  History of Present Illness   Lisa Avila is a 68 year old female with COPD and cavitary lung nodules who presents for follow-up.  She has experienced increased difficulty breathing over the past couple of weeks, which she attributes to the heat and possibly allergies. She avoids going out during the hottest parts of the day, preferring early mornings and late evenings. No significant cough is noted. She uses a nebulizer at home and may need to increase its use to a couple of times a day due to the current weather conditions.  She has a history of using Trelegy for her COPD and has not tried Breztri  before. She is currently not smoking a full pack of cigarettes each day and is attempting to quit using nicotine  patches, although she finds them less effective than previous versions. She has considered calling a 1 800 QUIT NOW for additional support.  Her sleep is disrupted, often waking at  night and feeling a burst of energy, which she attributes to the nicotine  patches. She reports limited physical activity due to the heat but has been trying to maintain some level of exercise, including using an exercise chair at home.  We discussed pulmonary rehab today.  She has a family history of lung disease, as her father had similar issues before passing away. She uses an incentive spirometer at home, which she finds helpful in managing her symptoms.     The patient had CT chest performed 15 June 2024, it has not been interpreted by radiology yet however I have independently reviewed the film.  The previously noted cavitary nodules have resolved after antibiotic therapy for Serratia marcescens.  As noted however, radiology interpretation is still pending.  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  She is compliant with oxygen  nocturnally.  She notes benefit of the therapy.  DATA 12/05/2019 PFT: FEV1 0.65 L or 28% predicted, FVC 1.58 L or 52% predicted, FEV1/FVC 41%. Postbronchodilator FEV1 with 20% no change and FVC with 14% no change indicating a degree of airway obstruction reversibility.  There is air trapping noted.  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 12/25/2019 2D echo: LVEF 60 to 65%.  Borderline LVH.  Abnormalities.  Mildly elevated pulmonary artery systolic pressure.  RV normal. 05/22/2020 low-dose chest CT: Multiple small lung nodules, benign in appearance, diffuse bronchial wall thickening and centrilobular and paraseptal  emphysema 07/18/2021 low-dose chest CT: Multiple small lung nodules that are benign in appearance again noted.  Some reduced in size others stable.  Centrilobular emphysema again noted.  Follow-up in a year. 09/25/2021 PFTs: FEV1 0.71 L or 31% predicted, FVC 1.64 L or 55% predicted, FEV1/FVC 43%, lung volumes showed air trapping, no  significant bronchodilator response.  Diffusion capacity severely reduced. 07/20/2022 LDCT: Lung RADS 2, moderate centrilobular emphysema, isolated right middle lobe pulmonary nodule 3 mm in size, mixed attenuation nodule on the prior exam has resolved. 07/13/2023 PFTs: FEV1 is 0.61 L or 27% predicted, FVC 1.60 L or 54% predicted, FEV1/FVC 38%, lung volumes show air trapping, diffusion capacity is severely impaired.  Compared to prior study of 25 September 2021 there has been further decline in FEV1. 07/22/2023 LDCT chest: Lung RADS 3 probably benign findings.  Short-term follow-up in 6 months recommended.  Multiple pulmonary nodules without visible growth.  New left upper lobe nodule measuring 4.3 mm favor mucous impaction. 02/22/2024 LDCT chest: Scattered areas of new nodular consolidation in the lungs possibly infectious/inflammatory in etiology.  Malignancy cannot be excluded.  Recommend follow-up low-dose lung cancer screening CT in 4 to 6 weeks after appropriate clinical therapy as clinically indicated.  Emphysema.  Aortic atherosclerosis. 03/13/2024 PET/CT: Independent review, there is area of uptake on multiple lung nodules some of the lung nodules appears to have regressed in size.  Still suggestive of potential infectious/inflammatory process, formal interpretation by radiology confirms.  Review of Systems A 10 point review of systems was performed and it is as noted above otherwise negative.   Patient Active Problem List   Diagnosis Date Noted   Mixed hyperlipidemia 08/27/2023   Adenomatous polyp of colon 05/25/2023   Restless leg syndrome 05/10/2023   Major depression single episode, in partial remission (HCC) 05/10/2023   Nocturnal hypoxemia due to emphysema (HCC) 05/10/2023   COPD GOLD C 02/26/2014   Severe tobacco use disorder 02/26/2014    Social History   Tobacco Use   Smoking status: Every Day    Current packs/day: 1.00    Average packs/day: 1 pack/day for 50.0 years (50.0  ttl pk-yrs)    Types: Cigarettes   Smokeless tobacco: Never   Tobacco comments:    15 cigarettes daily- khj 06/22/2024        Started smoking at 69 years old    Smoked 2 PPD at her heaviest  Substance Use Topics   Alcohol use: No    Alcohol/week: 0.0 standard drinks of alcohol    No Known Allergies  Current Meds  Medication Sig   albuterol  (PROVENTIL ) (2.5 MG/3ML) 0.083% nebulizer solution Take 3 mLs (2.5 mg total) by nebulization every 6 (six) hours as needed for wheezing or shortness of breath.   albuterol  (VENTOLIN  HFA) 108 (90 Base) MCG/ACT inhaler Inhale 2 puffs into the lungs every 6 (six) hours as needed for wheezing or shortness of breath.   budesonide-glycopyrrolate-formoterol  (BREZTRI  AEROSPHERE) 160-9-4.8 MCG/ACT AERO inhaler Inhale 2 puffs into the lungs in the morning and at bedtime.   cyclobenzaprine (FLEXERIL) 10 MG tablet Take 10 mg by mouth 3 (three) times daily as needed.   esomeprazole (NEXIUM) 20 MG capsule Take 20 mg by mouth daily at 12 noon.   OXYGEN  Inhale 2.5 L into the lungs at bedtime.   predniSONE  (STERAPRED UNI-PAK 21 TAB) 10 MG (21) TBPK tablet Take as directed in the package.   rOPINIRole (REQUIP) 0.5 MG tablet Take 0.5 tablets by mouth as needed.   rosuvastatin  (  CRESTOR ) 5 MG tablet Take 1 tablet (5 mg total) by mouth daily.   sertraline  (ZOLOFT ) 50 MG tablet Take 1 tablet (50 mg total) by mouth daily.   Spacer/Aero-Holding Chambers (AEROCHAMBER MV) inhaler Use as instructed to aid with inhaled medications.   traMADol  (ULTRAM ) 50 MG tablet Take 1 tablet (50 mg total) by mouth at bedtime.   TRELEGY ELLIPTA  200-62.5-25 MCG/ACT AEPB Inhale 1 puff into the lungs daily.    Immunization History  Administered Date(s) Administered   Fluad Trivalent(High Dose 65+) 10/27/2023   Influenza Split 10/29/2013, 11/13/2014   Influenza,inj,Quad PF,6+ Mos 10/03/2018, 10/15/2020, 10/08/2022   Influenza-Unspecified 10/09/2019, 10/22/2021   PFIZER(Purple Top)SARS-COV-2  Vaccination 07/13/2020, 08/03/2020   PNEUMOCOCCAL CONJUGATE-20 08/26/2023   Pneumococcal Conjugate-13 11/13/2014   Tdap 08/11/2016        Objective:     BP 110/70 (BP Location: Right Arm, Cuff Size: Normal)   Pulse 94   Temp (!) 97.1 F (36.2 C)   Ht 5' 2 (1.575 m)   Wt 138 lb 6.4 oz (62.8 kg)   SpO2 97%   BMI 25.31 kg/m   SpO2: 97 % O2 Device: None (Room air)  GENERAL: Well-developed well-nourished woman, no acute distress.  She is fully ambulatory.  No conversational dyspnea.  Non toxic appearing. HEAD: Normocephalic, atraumatic. EYES: Pupils equal, round, reactive to light.  No scleral icterus. MOUTH: Dentures, upper or lower no thrush.  Oral mucosa moist. NECK: Supple. No thyromegaly. Trachea midline. No JVD.  No adenopathy. PULMONARY: Good air entry bilaterally.  Coarse breath sounds otherwise, generalized wheezing noted. CARDIOVASCULAR: S1 and S2. Regular rate and rhythm.  No rubs, murmurs or gallops heard. ABDOMEN: Benign. MUSCULOSKELETAL: Kyphosis noted, no clubbing, no edema. NEUROLOGIC: No focal deficit, no gait disturbance, speech is fluent. SKIN: Intact,warm,dry. PSYCH: Mood and behavior normal.   Patient received nebulization treatment with albuterol  2.5 mg x 1: After nebulization treatment patient's bronchospasm was markedly improved and patient's symptoms improved.  Lab Results  Component Value Date   NITRICOXIDE 6 06/22/2024  *No type II inflammation noted.   Assessment & Plan:     ICD-10-CM   1. Bronchospasm  J98.01 Nitric oxide     albuterol  (PROVENTIL ) (2.5 MG/3ML) 0.083% nebulizer solution 2.5 mg    budesonide-glycopyrrolate-formoterol  (BREZTRI  AEROSPHERE) 160-9-4.8 MCG/ACT AERO inhaler    2. COPD with acute exacerbation (HCC)  J44.1     3. Stage 3 severe COPD by GOLD classification (HCC)  J44.9     4. Nocturnal hypoxemia due to emphysema (HCC)  J43.9    G47.36     5. Multiple lung nodules on CT  R91.8     6. Serratia marcescens  infection - RESOLVED  A48.8     7. Tobacco dependence due to cigarettes  F17.210       Orders Placed This Encounter  Procedures   Nitric oxide     Meds ordered this encounter  Medications   albuterol  (PROVENTIL ) (2.5 MG/3ML) 0.083% nebulizer solution 2.5 mg   budesonide-glycopyrrolate-formoterol  (BREZTRI  AEROSPHERE) 160-9-4.8 MCG/ACT AERO inhaler    Sig: Inhale 2 puffs into the lungs in the morning and at bedtime.   predniSONE  (STERAPRED UNI-PAK 21 TAB) 10 MG (21) TBPK tablet    Sig: Take as directed in the package.    Dispense:  21 tablet    Refill:  0   Discussion:    Chronic Obstructive Pulmonary Disease (COPD) COPD with recent exacerbation likely due to environmental factors and smoking. Wheezing present. Current treatment with Trelegy is ineffective. Discussed  potential benefit of Breztri  as an alternative. Smoking cessation is crucial for management and medication qualification (considering Ohtuvayre ). - Prescribe prednisone  taper for exacerbation management. - Administer nebulizer treatment in office and advise home use as needed. - Provide Breztri  samples for trial and assess response. - Discontinue Trelegy while on Breztri . - Counseled with regards to smoking cessation and provide guidance on nicotine  patch use, emphasizing removal at night to avoid sleep disturbances. - Discuss enrollment in pulmonary rehabilitation program.  Cavitary lung nodules Cavitary lung nodules previously treated with antibiotics, showing resolution on recent scan. No current need for bronchoscopy as nodules appear resolved.  These were likely due to Serratia marcescens infection.    Will see the patient in follow-up in 4 to 6 weeks time she is to contact us  prior to that time strain with the goal of these arise.  Advised if symptoms do not improve or worsen, to please contact office for sooner follow up or seek emergency care.    I spent 43 minutes of dedicated to the care of this patient on  the date of this encounter to include pre-visit review of records, face-to-face time with the patient discussing conditions above, post visit ordering of testing, clinical documentation with the electronic health record, making appropriate referrals as documented, and communicating necessary findings to members of the patients care team.     C. Leita Sanders, MD Advanced [facility]ch[facility]    *This note was generated using voice recognition software/Dragon and/or AI transcription program.  Despite best efforts to proofread, errors can occur which can change the meaning. Any transcriptional errors that result from this process are unintentional and may not be fully corrected at the time of dictation.

## 2024-06-22 NOTE — Patient Instructions (Signed)
 VISIT SUMMARY:  You came in today for a follow-up visit regarding your COPD and cavitary lung nodules. You mentioned increased difficulty breathing, which you think might be due to the heat and possibly allergies. You are using your nebulizer more frequently and are trying to quit smoking with the help of nicotine  patches. Your sleep has been disrupted, and you are trying to stay active despite the heat.  YOUR PLAN:  -CHRONIC OBSTRUCTIVE PULMONARY DISEASE (COPD): COPD is a chronic lung disease that makes it hard to breathe. Your recent breathing difficulties are likely due to environmental factors and smoking. We will start you on a prednisone  taper to manage the exacerbation. You received a nebulizer treatment in the office and should continue using it at home as needed. We provided you with samples of Breztri to try as an alternative to Trelegy. It's important to continue your efforts to quit smoking, and you should remove the nicotine  patch at night to help with your sleep. We also discussed the benefits of enrolling in a pulmonary rehabilitation program.  -CAVITARY LUNG NODULES: Cavitary lung nodules are areas of lung tissue that have been damaged and formed cavities. Your recent scan shows improvement, and there is no current need for further procedures like a bronchoscopy.  INSTRUCTIONS:  Please follow the prednisone  taper as prescribed. Use your nebulizer at home as needed. Try the Breztri samples and let us  know how you respond to it. Continue your efforts to quit smoking and consider calling a quitline for additional support. Remove the nicotine  patch at night to help with sleep. Consider enrolling in a pulmonary rehabilitation program. We will schedule a follow-up appointment to monitor your progress.

## 2024-06-23 ENCOUNTER — Encounter: Payer: Self-pay | Admitting: Pulmonary Disease

## 2024-06-26 ENCOUNTER — Ambulatory Visit: Payer: Self-pay | Admitting: Pulmonary Disease

## 2024-07-12 ENCOUNTER — Encounter: Payer: Self-pay | Admitting: Internal Medicine

## 2024-07-12 ENCOUNTER — Other Ambulatory Visit: Payer: Self-pay | Admitting: Pulmonary Disease

## 2024-07-12 ENCOUNTER — Other Ambulatory Visit: Payer: Self-pay

## 2024-07-12 ENCOUNTER — Encounter: Attending: Pulmonary Disease

## 2024-07-12 DIAGNOSIS — J4489 Other specified chronic obstructive pulmonary disease: Secondary | ICD-10-CM

## 2024-07-12 DIAGNOSIS — J449 Chronic obstructive pulmonary disease, unspecified: Secondary | ICD-10-CM | POA: Insufficient documentation

## 2024-07-12 NOTE — Telephone Encounter (Signed)
 Please review and send RX if appropriate.  JM

## 2024-07-12 NOTE — Progress Notes (Signed)
 Virtual Visit completed. Patient informed on EP and RD appointment and 6 Minute walk test. Patient also informed of patient health questionnaires on My Chart. Patient Verbalizes understanding. Visit diagnosis can be found in [facility] 06/22/2024.

## 2024-07-19 ENCOUNTER — Ambulatory Visit

## 2024-07-24 ENCOUNTER — Encounter: Admitting: *Deleted

## 2024-07-24 DIAGNOSIS — J4489 Other specified chronic obstructive pulmonary disease: Secondary | ICD-10-CM

## 2024-07-24 NOTE — Progress Notes (Signed)
 Incomplete Session Note  Patient Details  Name: Lisa Avila MRN: [id number] Date of Birth: 06/15/1956 Referring Provider:    Heron JAYSON Daring did not complete her rehab session.  [name] came today for her orientation appointment for pulmonary rehab and was wearing flip flops, so we were unable to complete 6 MWT and other evaluations. Scheduling and paperwork was completed and she will do her 6 MWT on her first day of class.

## 2024-07-27 ENCOUNTER — Ambulatory Visit

## 2024-07-27 VITALS — Ht 62.3 in | Wt 138.6 lb

## 2024-07-27 DIAGNOSIS — J449 Chronic obstructive pulmonary disease, unspecified: Secondary | ICD-10-CM | POA: Diagnosis not present

## 2024-07-27 DIAGNOSIS — J4489 Other specified chronic obstructive pulmonary disease: Secondary | ICD-10-CM

## 2024-07-27 NOTE — Patient Instructions (Signed)
 Patient Instructions  Patient Details  Name: Lisa Avila MRN: [id number] Date of Birth: 1956-10-08 Referring Provider:  Justus Leita DEL, MD  Below are your personal goals for exercise, nutrition, and risk factors. Our goal is to help you stay on track towards obtaining and maintaining these goals. We will be discussing your progress on these goals with you throughout the program.  Initial Exercise Prescription:  Initial Exercise Prescription - 07/27/24 1100       Date of Initial Exercise RX and Referring Provider   Date 07/27/24    Referring Provider Dr. Leita Justus      Oxygen    Oxygen  Continuous    Liters 0L    Maintain Oxygen  Saturation 88% or higher      Recumbant Bike   Level 1    RPM 50    Watts 25    Minutes 15    METs 2.3      NuStep   Level 1    SPM 80    Minutes 15    METs 2.3      REL-XR   Level 1    Watts 25    Speed 50    Minutes 15    METs 2.3      T5 Nustep   Level 1    SPM 80    Minutes 15    METs 2.3      Biostep-RELP   Level 1    SPM 50    Minutes 15    METs 2.3      Track   Laps 20    Minutes 15    METs 2.3      Prescription Details   Duration Progress to 30 minutes of continuous aerobic without signs/symptoms of physical distress      Intensity   THRR 40-80% of Max Heartrate 113-140    Ratings of Perceived Exertion 11-13    Perceived Dyspnea 0-4      Progression   Progression Continue to progress workloads to maintain intensity without signs/symptoms of physical distress.      Resistance Training   Training Prescription Yes    Weight 2lb    Reps 10-15          Exercise Goals: Frequency: Be able to perform aerobic exercise two to three times per week in program working toward 2-5 days per week of home exercise.  Intensity: Work with a perceived exertion of 11 (fairly light) - 15 (hard) while following your exercise prescription.  We will make changes to your prescription with you as you progress through the  program.   Duration: Be able to do 30 to 45 minutes of continuous aerobic exercise in addition to a 5 minute warm-up and a 5 minute cool-down routine.   Nutrition Goals: Your personal nutrition goals will be established when you do your nutrition analysis with the dietician.  The following are general nutrition guidelines to follow: Cholesterol < 200mg /day Sodium < 1500mg /day Fiber: Women over 50 yrs - 21 grams per day  Personal Goals:  Personal Goals and Risk Factors at Admission - 07/24/24 1506       Core Components/Risk Factors/Patient Goals on Admission    Weight Management Yes;Weight Loss    Intervention Weight Management: Develop a combined nutrition and exercise program designed to reach desired caloric intake, while maintaining appropriate intake of nutrient and fiber, sodium and fats, and appropriate energy expenditure required for the weight goal.;Weight Management: Provide education and appropriate resources to help participant  work on and attain dietary goals.;Weight Management/Obesity: Establish reasonable short term and long term weight goals.    Admit Weight 139 lb 12.8 oz (63.4 kg)    Goal Weight: Short Term 134 lb (60.8 kg)    Goal Weight: Long Term 134 lb (60.8 kg)    Expected Outcomes Short Term: Continue to assess and modify interventions until short term weight is achieved;Weight Loss: Understanding of general recommendations for a balanced deficit meal plan, which promotes 1-2 lb weight loss per week and includes a negative energy balance of [phone] kcal/d;Understanding recommendations for meals to include 15-35% energy as protein, 25-35% energy from fat, 35-60% energy from carbohydrates, less than 200mg  of dietary cholesterol, 20-35 gm of total fiber daily;Understanding of distribution of calorie intake throughout the day with the consumption of 4-5 meals/snacks    Tobacco Cessation Yes    Intervention Assist the participant in steps to quit. Provide individualized  education and counseling about committing to Tobacco Cessation, relapse prevention, and pharmacological support that can be provided by physician.;Education officer, environmental, assist with locating and accessing local/national Quit Smoking programs, and support quit date choice.    Expected Outcomes Short Term: Will demonstrate readiness to quit, by selecting a quit date.;Long Term: Complete abstinence from all tobacco products for at least 12 months from quit date.;Short Term: Will quit all tobacco product use, adhering to prevention of relapse plan.    Improve shortness of breath with ADL's Yes    Intervention Provide education, individualized exercise plan and daily activity instruction to help decrease symptoms of SOB with activities of daily living.    Expected Outcomes Short Term: Improve cardiorespiratory fitness to achieve a reduction of symptoms when performing ADLs;Long Term: Be able to perform more ADLs without symptoms or delay the onset of symptoms    Hypertension Yes    Intervention Provide education on lifestyle modifcations including regular physical activity/exercise, weight management, moderate sodium restriction and increased consumption of fresh fruit, vegetables, and low fat dairy, alcohol moderation, and smoking cessation.;Monitor prescription use compliance.    Expected Outcomes Short Term: Continued assessment and intervention until BP is < 140/53mm HG in hypertensive participants. < 130/33mm HG in hypertensive participants with diabetes, heart failure or chronic kidney disease.;Long Term: Maintenance of blood pressure at goal levels.          Tobacco Use Initial Evaluation: Social History   Tobacco Use  Smoking Status Every Day   Current packs/day: 1.00   Average packs/day: 1 pack/day for 50.0 years (50.0 ttl pk-yrs)   Types: Cigarettes  Smokeless Tobacco Never  Tobacco Comments   15 cigarettes daily- khj 06/22/2024      Started smoking at 68 years old   Smoked 2  PPD at her heaviest    Exercise Goals and Review:  Exercise Goals     Row Name 07/27/24 1136             Exercise Goals   Increase Physical Activity Yes       Intervention Provide advice, education, support and counseling about physical activity/exercise needs.;Develop an individualized exercise prescription for aerobic and resistive training based on initial evaluation findings, risk stratification, comorbidities and participant's personal goals.       Expected Outcomes Short Term: Attend rehab on a regular basis to increase amount of physical activity.;Long Term: Exercising regularly at least 3-5 days a week.;Long Term: Add in home exercise to make exercise part of routine and to increase amount of physical activity.  Increase Strength and Stamina Yes       Intervention Provide advice, education, support and counseling about physical activity/exercise needs.;Develop an individualized exercise prescription for aerobic and resistive training based on initial evaluation findings, risk stratification, comorbidities and participant's personal goals.       Expected Outcomes Short Term: Increase workloads from initial exercise prescription for resistance, speed, and METs.;Short Term: Perform resistance training exercises routinely during rehab and add in resistance training at home;Long Term: Improve cardiorespiratory fitness, muscular endurance and strength as measured by increased METs and functional capacity ( )       Able to understand and use rate of perceived exertion (RPE) scale Yes       Intervention Provide education and explanation on how to use RPE scale       Expected Outcomes Short Term: Able to use RPE daily in rehab to express subjective intensity level;Long Term:  Able to use RPE to guide intensity level when exercising independently       Able to understand and use Dyspnea scale Yes       Intervention Provide education and explanation on how to use Dyspnea scale        Expected Outcomes Short Term: Able to use Dyspnea scale daily in rehab to express subjective sense of shortness of breath during exertion;Long Term: Able to use Dyspnea scale to guide intensity level when exercising independently       Knowledge and understanding of Target Heart Rate Range (THRR) Yes       Intervention Provide education and explanation of THRR including how the numbers were predicted and where they are located for reference       Expected Outcomes Short Term: Able to state/look up THRR;Short Term: Able to use daily as guideline for intensity in rehab;Long Term: Able to use THRR to govern intensity when exercising independently       Able to check pulse independently Yes       Intervention Provide education and demonstration on how to check pulse in carotid and radial arteries.;Review the importance of being able to check your own pulse for safety during independent exercise       Expected Outcomes Short Term: Able to explain why pulse checking is important during independent exercise;Long Term: Able to check pulse independently and accurately       Understanding of Exercise Prescription Yes       Intervention Provide education, explanation, and written materials on patient's individual exercise prescription       Expected Outcomes Short Term: Able to explain program exercise prescription;Long Term: Able to explain home exercise prescription to exercise independently          Copy of goals given to participant.

## 2024-07-27 NOTE — Progress Notes (Signed)
 Daily Session Note  Patient Details  Name: Lisa Avila MRN: [id number] Date of Birth: July 23, 1956 Referring Provider:   Flowsheet Row Pulmonary Rehab from 07/27/2024 in [facility]  Referring Provider Dr. Leita Adie    Encounter Date: 07/27/2024  Check In:  Session Check In - 07/27/24 1213       Check-In   Supervising physician immediately available to respond to emergencies See telemetry face sheet for immediately available ER MD    Location [facility]-Cardiac & Pulmonary Rehab    Staff Present Maxon Conetta BS, Exercise Physiologist;Jason Elnor RDN,LDN;Joseph Rolinda RCP,RRT,BSRT;Krista Jacques RN,BSN    Virtual Visit No    Medication changes reported     No    Fall or balance concerns reported    No    Tobacco Cessation No Change    Warm-up and Cool-down Performed on first and last piece of equipment    Resistance Training Performed Yes    VAD Patient? No    PAD/SET Patient? No      Pain Assessment   Currently in Pain? No/denies            Exercise Prescription Changes - 07/27/24 1100       Response to Exercise   Blood Pressure (Admit) 136/72    Blood Pressure (Exercise) 146/64    Blood Pressure (Exit) 122/62    Heart Rate (Admit) 86 bpm    Heart Rate (Exercise) 92 bpm    Heart Rate (Exit) 88 bpm    Oxygen  Saturation (Admit) 91 %    Oxygen  Saturation (Exercise) 91 %    Oxygen  Saturation (Exit) 96 %    Rating of Perceived Exertion (Exercise) 13    Perceived Dyspnea (Exercise) 3    Symptoms SOB    Comments results          Social History   Tobacco Use  Smoking Status Every Day   Current packs/day: 1.00   Average packs/day: 1 pack/day for 50.0 years (50.0 ttl pk-yrs)   Types: Cigarettes  Smokeless Tobacco Never  Tobacco Comments   15 cigarettes daily- khj 06/22/2024      Started smoking at 68 years old   Smoked 2 PPD at her heaviest    Goals Met:  Independence with exercise equipment Exercise tolerated well No report of  concerns or symptoms today Strength training completed today  Goals Unmet:  Not Applicable  Comments: First full day of exercise!  Patient was oriented to gym and equipment including functions, settings, policies, and procedures.  Patient's individual exercise prescription and treatment plan were reviewed.  All starting workloads were established based on the results of the 6 minute walk test done at initial orientation visit.  The plan for exercise progression was also introduced and progression will be customized based on patient's performance and goals.    Dr. Oneil Pinal is Medical Director for [facility] Card[facility].  Dr. Fuad Aleskerov is Medical Director for [facility] Pulmonary Rehabilitation.

## 2024-07-27 NOTE — Progress Notes (Signed)
 Pulmonary Individual Treatment Plan  Patient Details  Name: Lisa Avila MRN: [id number] Date of Birth: 26-Mar-1956 Referring Provider:   Flowsheet Row Pulmonary Rehab from 07/27/2024 in [facility] Cardiac and Pulmonary Rehab  Referring Provider Dr. Leita Adie    Initial Encounter Date:  Flowsheet Row Pulmonary Rehab from 07/27/2024 in [facility] Cardiac and Pulmonary Rehab  Date 07/27/24    Visit Diagnosis: COPD with asthma (HCC)  Patient's Home Medications on Admission:  Current Outpatient Medications:    albuterol  (PROVENTIL ) (2.5 MG/3ML) 0.083% nebulizer solution, Take 3 mLs (2.5 mg total) by nebulization every 6 (six) hours as needed for wheezing or shortness of breath., Disp: 75 mL, Rfl: 12   albuterol  (VENTOLIN  HFA) 108 (90 Base) MCG/ACT inhaler, TAKE 2 PUFFS BY MOUTH EVERY 6 HOURS AS NEEDED FOR WHEEZE OR SHORTNESS OF BREATH, Disp: 6.7 each, Rfl: 6   budesonide-glycopyrrolate-formoterol  (BREZTRI  AEROSPHERE) 160-9-4.8 MCG/ACT AERO inhaler, Inhale 2 puffs into the lungs in the morning and at bedtime. (Patient not taking: Reported on 07/12/2024), Disp: , Rfl:    cyclobenzaprine (FLEXERIL) 10 MG tablet, Take 10 mg by mouth 3 (three) times daily as needed., Disp: , Rfl:    esomeprazole (NEXIUM) 20 MG capsule, Take 20 mg by mouth daily at 12 noon., Disp: , Rfl:    OXYGEN , Inhale 2.5 L into the lungs at bedtime., Disp: , Rfl:    predniSONE  (STERAPRED UNI-PAK 21 TAB) 10 MG (21) TBPK tablet, Take as directed in the package. (Patient not taking: Reported on 07/12/2024), Disp: 21 tablet, Rfl: 0   rOPINIRole (REQUIP) 0.5 MG tablet, Take 0.5 tablets by mouth as needed., Disp: , Rfl:    rosuvastatin  (CRESTOR ) 5 MG tablet, Take 1 tablet (5 mg total) by mouth daily., Disp: 90 tablet, Rfl: 1   sertraline  (ZOLOFT ) 50 MG tablet, Take 1 tablet (50 mg total) by mouth daily., Disp: 90 tablet, Rfl: 1   Spacer/Aero-Holding Chambers (AEROCHAMBER MV) inhaler, Use as instructed to aid with inhaled medications.,  Disp: 1 each, Rfl: 0   traMADol  (ULTRAM ) 50 MG tablet, Take 1 tablet (50 mg total) by mouth at bedtime., Disp: 90 tablet, Rfl: 1   TRELEGY ELLIPTA  200-62.5-25 MCG/ACT AEPB, Inhale 1 puff into the lungs daily., Disp: 60 each, Rfl: 10 No current facility-administered medications for this visit.  Facility-Administered Medications Ordered in Other Visits:    albuterol  (PROVENTIL ) (2.5 MG/3ML) 0.083% nebulizer solution 2.5 mg, 2.5 mg, Nebulization, Once, Tamea Dedra CROME, MD  Past Medical History: Past Medical History:  Diagnosis Date   Anxiety    Asthma    COPD (chronic obstructive pulmonary disease) (HCC)    Emphysema lung (HCC)    Fibroids    History of colonic polyps    Reflux    Restless leg syndrome     Tobacco Use: Social History   Tobacco Use  Smoking Status Every Day   Current packs/day: 1.00   Average packs/day: 1 pack/day for 50.0 years (50.0 ttl pk-yrs)   Types: Cigarettes  Smokeless Tobacco Never  Tobacco Comments   15 cigarettes daily- khj 06/22/2024      Started smoking at 68 years old   Smoked 2 PPD at her heaviest    Labs: Review Flowsheet       Latest Ref Rng & Units 03/19/2020 08/26/2023 01/13/2024  Labs for ITP Cardiac and Pulmonary Rehab  Cholestrol 100 - 199 mg/dL 785     765  822   LDL (calc) 0 - 99 mg/dL 857     844  101   HDL-C >39 mg/dL 50     60  59   Trlycerides 0 - 149 mg/dL 874     892  95     Details       This result is from an external source.          Pulmonary Assessment Scores:  Pulmonary Assessment Scores     Row Name 07/24/24 1510         ADL UCSD   ADL Phase Entry     SOB Score total 44     Rest 0     Walk 3     Stairs 1     Bath 2     Dress 2     Shop 0       CAT Score   CAT Score 22       mMRC Score   mMRC Score 3        UCSD: Self-administered rating of dyspnea associated with activities of daily living (ADLs) 6-point scale (0 = not at all to 5 = maximal or unable to do because of  breathlessness)  Scoring Scores range from 0 to 120.  Minimally important difference is 5 units  CAT: CAT can identify the health impairment of COPD patients and is better correlated with disease progression.  CAT has a scoring range of zero to 40. The CAT score is classified into four groups of low (less than 10), medium (10 - 20), high (21-30) and very high (31-40) based on the impact level of disease on health status. A CAT score over 10 suggests significant symptoms.  A worsening CAT score could be explained by an exacerbation, poor medication adherence, poor inhaler technique, or progression of COPD or comorbid conditions.  CAT MCID is 2 points  mMRC: mMRC (Modified Medical Research Council) Dyspnea Scale is used to assess the degree of baseline functional disability in patients of respiratory disease due to dyspnea. No minimal important difference is established. A decrease in score of 1 point or greater is considered a positive change.   Pulmonary Function Assessment:  Pulmonary Function Assessment - 07/12/24 1425       Breath   Shortness of Breath Yes;Limiting activity;Fear of Shortness of Breath          Exercise Target Goals: Exercise Program Goal: Individual exercise prescription set using results from initial 6 min walk test and THRR while considering  patient's activity barriers and safety.   Exercise Prescription Goal: Initial exercise prescription builds to 30-45 minutes a day of aerobic activity, 2-3 days per week.  Home exercise guidelines will be given to patient during program as part of exercise prescription that the participant will acknowledge.  Education: Aerobic Exercise: - Group verbal and visual presentation on the components of exercise prescription. Introduces F.I.T.T principle from ACSM for exercise prescriptions.  Reviews F.I.T.T. principles of aerobic exercise including progression. Written material given at graduation.   Education: Resistance  Exercise: - Group verbal and visual presentation on the components of exercise prescription. Introduces F.I.T.T principle from ACSM for exercise prescriptions  Reviews F.I.T.T. principles of resistance exercise including progression. Written material given at graduation.    Education: Exercise & Equipment Safety: - Individual verbal instruction and demonstration of equipment use and safety with use of the equipment. Flowsheet Row Pulmonary Rehab from 07/24/2024 in [facility] Cardiac and Pulmonary Rehab  Date 07/24/24  Educator [facility]  Instruction Review Code 5- Refused Teaching    Education: Exercise Physiology & General Exercise  Guidelines: - Group verbal and written instruction with models to review the exercise physiology of the cardiovascular system and associated critical values. Provides general exercise guidelines with specific guidelines to those with heart or lung disease.    Education: Flexibility, Balance, Mind/Body Relaxation: - Group verbal and visual presentation with interactive activity on the components of exercise prescription. Introduces F.I.T.T principle from ACSM for exercise prescriptions. Reviews F.I.T.T. principles of flexibility and balance exercise training including progression. Also discusses the mind body connection.  Reviews various relaxation techniques to help reduce and manage stress (i.e. Deep breathing, progressive muscle relaxation, and visualization). Balance handout provided to take home. Written material given at graduation.   Activity Barriers & Risk Stratification:  Activity Barriers & Cardiac Risk Stratification - 07/27/24 1130       Activity Barriers & Cardiac Risk Stratification   Activity Barriers Back Problems;Deconditioning          6 Minute Walk:  6 Minute Walk     Row Name 07/27/24 1128         6 Minute Walk   Phase Initial     Distance 800 feet     Walk Time 4.22 minutes     # of Rest Breaks 3     MPH 2.15     METS 2.3     RPE 13      Perceived Dyspnea  3     VO2 Peak 8.05     Symptoms Yes (comment)     Comments SOB     Resting HR 86 bpm     Resting BP 136/72     Resting Oxygen  Saturation  91 %     Exercise Oxygen  Saturation  during 6 min walk 91 %     Max Ex. HR 92 bpm     Max Ex. BP 146/64     2 Minute Post BP 122/62       Interval HR   1 Minute HR 92     2 Minute HR 82     3 Minute HR 72     4 Minute HR 82     5 Minute HR 83     6 Minute HR 84     2 Minute Post HR 88     Interval Heart Rate? Yes       Interval Oxygen    Interval Oxygen ? Yes     Baseline Oxygen  Saturation % 91 %     1 Minute Oxygen  Saturation % 94 %     1 Minute Liters of Oxygen  0 L     2 Minute Oxygen  Saturation % 93 %     2 Minute Liters of Oxygen  0 L     3 Minute Oxygen  Saturation % 93 %     3 Minute Liters of Oxygen  0 L     4 Minute Oxygen  Saturation % 92 %     4 Minute Liters of Oxygen  0 L     5 Minute Oxygen  Saturation % 91 %     5 Minute Liters of Oxygen  0 L     6 Minute Oxygen  Saturation % 92 %     6 Minute Liters of Oxygen  0 L     2 Minute Post Oxygen  Saturation % 96 %     2 Minute Post Liters of Oxygen  0 L       Oxygen  Initial Assessment:  Oxygen  Initial Assessment - 07/12/24 1424       Home Oxygen   Home Oxygen  Device Portable Concentrator;Home Concentrator    Sleep Oxygen  Prescription Continuous    Liters per minute 2.5    Home Exercise Oxygen  Prescription None    Home Resting Oxygen  Prescription None    Compliance with Home Oxygen  Use Yes      Initial 6 min Walk   Oxygen  Used None      Program Oxygen  Prescription   Program Oxygen  Prescription None      Intervention   Short Term Goals To learn and exhibit compliance with exercise, home and travel O2 prescription;To learn and understand importance of maintaining oxygen  saturations>88%;To learn and demonstrate proper use of respiratory medications;To learn and understand importance of monitoring SPO2 with pulse oximeter and demonstrate accurate use of the  pulse oximeter.;To learn and demonstrate proper pursed lip breathing techniques or other breathing techniques.     Long  Term Goals Verbalizes importance of monitoring SPO2 with pulse oximeter and return demonstration;Exhibits proper breathing techniques, such as pursed lip breathing or other method taught during program session;Demonstrates proper use of MDI's;Compliance with respiratory medication;Maintenance of O2 saturations>88%;Exhibits compliance with exercise, home  and travel O2 prescription          Oxygen  Re-Evaluation:   Oxygen  Discharge (Final Oxygen  Re-Evaluation):   Initial Exercise Prescription:  Initial Exercise Prescription - 07/27/24 1100       Date of Initial Exercise RX and Referring Provider   Date 07/27/24    Referring Provider Dr. Leita Adie      Oxygen    Oxygen  Continuous    Liters 0L    Maintain Oxygen  Saturation 88% or higher      Recumbant Bike   Level 1    RPM 50    Watts 25    Minutes 15    METs 2.3      NuStep   Level 1    SPM 80    Minutes 15    METs 2.3      REL-XR   Level 1    Watts 25    Speed 50    Minutes 15    METs 2.3      T5 Nustep   Level 1    SPM 80    Minutes 15    METs 2.3      Biostep-RELP   Level 1    SPM 50    Minutes 15    METs 2.3      Track   Laps 20    Minutes 15    METs 2.3      Prescription Details   Duration Progress to 30 minutes of continuous aerobic without signs/symptoms of physical distress      Intensity   THRR 40-80% of Max Heartrate 113-140    Ratings of Perceived Exertion 11-13    Perceived Dyspnea 0-4      Progression   Progression Continue to progress workloads to maintain intensity without signs/symptoms of physical distress.      Resistance Training   Training Prescription Yes    Weight 2lb    Reps 10-15          Perform Capillary Blood Glucose checks as needed.  Exercise Prescription Changes:   Exercise Prescription Changes     Row Name 07/27/24 1100              Response to Exercise   Blood Pressure (Admit) 136/72       Blood Pressure (Exercise) 146/64       Blood Pressure (Exit)  122/62       Heart Rate (Admit) 86 bpm       Heart Rate (Exercise) 92 bpm       Heart Rate (Exit) 88 bpm       Oxygen  Saturation (Admit) 91 %       Oxygen  Saturation (Exercise) 91 %       Oxygen  Saturation (Exit) 96 %       Rating of Perceived Exertion (Exercise) 13       Perceived Dyspnea (Exercise) 3       Symptoms SOB       Comments results          Exercise Comments:   Exercise Goals and Review:   Exercise Goals     Row Name 07/27/24 1136             Exercise Goals   Increase Physical Activity Yes       Intervention Provide advice, education, support and counseling about physical activity/exercise needs.;Develop an individualized exercise prescription for aerobic and resistive training based on initial evaluation findings, risk stratification, comorbidities and participant's personal goals.       Expected Outcomes Short Term: Attend rehab on a regular basis to increase amount of physical activity.;Long Term: Exercising regularly at least 3-5 days a week.;Long Term: Add in home exercise to make exercise part of routine and to increase amount of physical activity.       Increase Strength and Stamina Yes       Intervention Provide advice, education, support and counseling about physical activity/exercise needs.;Develop an individualized exercise prescription for aerobic and resistive training based on initial evaluation findings, risk stratification, comorbidities and participant's personal goals.       Expected Outcomes Short Term: Increase workloads from initial exercise prescription for resistance, speed, and METs.;Short Term: Perform resistance training exercises routinely during rehab and add in resistance training at home;Long Term: Improve cardiorespiratory fitness, muscular endurance and strength as measured by increased METs and functional  capacity ( )       Able to understand and use rate of perceived exertion (RPE) scale Yes       Intervention Provide education and explanation on how to use RPE scale       Expected Outcomes Short Term: Able to use RPE daily in rehab to express subjective intensity level;Long Term:  Able to use RPE to guide intensity level when exercising independently       Able to understand and use Dyspnea scale Yes       Intervention Provide education and explanation on how to use Dyspnea scale       Expected Outcomes Short Term: Able to use Dyspnea scale daily in rehab to express subjective sense of shortness of breath during exertion;Long Term: Able to use Dyspnea scale to guide intensity level when exercising independently       Knowledge and understanding of Target Heart Rate Range (THRR) Yes       Intervention Provide education and explanation of THRR including how the numbers were predicted and where they are located for reference       Expected Outcomes Short Term: Able to state/look up THRR;Short Term: Able to use daily as guideline for intensity in rehab;Long Term: Able to use THRR to govern intensity when exercising independently       Able to check pulse independently Yes       Intervention Provide education and demonstration on how to check pulse in carotid and radial arteries.;Review the  importance of being able to check your own pulse for safety during independent exercise       Expected Outcomes Short Term: Able to explain why pulse checking is important during independent exercise;Long Term: Able to check pulse independently and accurately       Understanding of Exercise Prescription Yes       Intervention Provide education, explanation, and written materials on patient's individual exercise prescription       Expected Outcomes Short Term: Able to explain program exercise prescription;Long Term: Able to explain home exercise prescription to exercise independently          Exercise Goals  Re-Evaluation :   Discharge Exercise Prescription (Final Exercise Prescription Changes):  Exercise Prescription Changes - 07/27/24 1100       Response to Exercise   Blood Pressure (Admit) 136/72    Blood Pressure (Exercise) 146/64    Blood Pressure (Exit) 122/62    Heart Rate (Admit) 86 bpm    Heart Rate (Exercise) 92 bpm    Heart Rate (Exit) 88 bpm    Oxygen  Saturation (Admit) 91 %    Oxygen  Saturation (Exercise) 91 %    Oxygen  Saturation (Exit) 96 %    Rating of Perceived Exertion (Exercise) 13    Perceived Dyspnea (Exercise) 3    Symptoms SOB    Comments results          Nutrition:  Target Goals: Understanding of nutrition guidelines, daily intake of sodium 1500mg , cholesterol 200mg , calories 30% from fat and 7% or less from saturated fats, daily to have 5 or more servings of fruits and vegetables.  Education: All About Nutrition: -Group instruction provided by verbal, written material, interactive activities, discussions, models, and posters to present general guidelines for heart healthy nutrition including fat, fiber, MyPlate, the role of sodium in heart healthy nutrition, utilization of the nutrition label, and utilization of this knowledge for meal planning. Follow up email sent as well. Written material given at graduation. Flowsheet Row Pulmonary Rehab from 07/24/2024 in [facility] Cardiac and Pulmonary Rehab  Education need identified 07/24/24    Biometrics:  Pre Biometrics - 07/27/24 1136       Pre Biometrics   Height 5' 2.3 (1.582 m)    Weight 138 lb 9.3 oz (62.9 kg)    Waist Circumference 36 inches    Hip Circumference 40 inches    Waist to Hip Ratio 0.9 %    BMI (Calculated) 25.12    Single Leg Stand 30 seconds           Nutrition Therapy Plan and Nutrition Goals:  Nutrition Therapy & Goals - 07/24/24 1506       Nutrition Therapy   RD appointment deferred Yes          Nutrition Assessments:  MEDIFICTS Score Key: >=70 Need to make  dietary changes  40-70 Heart Healthy Diet <= 40 Therapeutic Level Cholesterol Diet  Flowsheet Row Pulmonary Rehab from 07/24/2024 in [facility] Cardiac and Pulmonary Rehab  Picture Your Plate Total Score on Admission 52   Picture Your Plate Scores: <59 Unhealthy dietary pattern with much room for improvement. 41-50 Dietary pattern unlikely to meet recommendations for good health and room for improvement. 51-60 More healthful dietary pattern, with some room for improvement.  >60 Healthy dietary pattern, although there may be some specific behaviors that could be improved.   Nutrition Goals Re-Evaluation:   Nutrition Goals Discharge (Final Nutrition Goals Re-Evaluation):   Psychosocial: Target Goals: Acknowledge presence or absence  of significant depression and/or stress, maximize coping skills, provide positive support system. Participant is able to verbalize types and ability to use techniques and skills needed for reducing stress and depression.   Education: Stress, Anxiety, and Depression - Group verbal and visual presentation to define topics covered.  Reviews how body is impacted by stress, anxiety, and depression.  Also discusses healthy ways to reduce stress and to treat/manage anxiety and depression.  Written material given at graduation.   Education: Sleep Hygiene -Provides group verbal and written instruction about how sleep can affect your health.  Define sleep hygiene, discuss sleep cycles and impact of sleep habits. Review good sleep hygiene tips.    Initial Review & Psychosocial Screening:  Initial Psych Review & Screening - 07/12/24 1427       Initial Review   Current issues with Current Psychotropic Meds;History of Depression      Family Dynamics   Good Support System? Yes    Comments She can look to her mother, brother, daughter and son for support. She takes some medication for her history of depression.      Barriers   Psychosocial barriers to participate in  program The patient should benefit from training in stress management and relaxation.      Screening Interventions   Interventions Encouraged to exercise;Provide feedback about the scores to participant;To provide support and resources with identified psychosocial needs    Expected Outcomes Short Term goal: Utilizing psychosocial counselor, staff and physician to assist with identification of specific Stressors or current issues interfering with healing process. Setting desired goal for each stressor or current issue identified.;Long Term Goal: Stressors or current issues are controlled or eliminated.;Short Term goal: Identification and review with participant of any Quality of Life or Depression concerns found by scoring the questionnaire.;Long Term goal: The participant improves quality of Life and PHQ9 Scores as seen by post scores and/or verbalization of changes          Quality of Life Scores:  Scores of 19 and below usually indicate a poorer quality of life in these areas.  A difference of  2-3 points is a clinically meaningful difference.  A difference of 2-3 points in the total score of the Quality of Life Index has been associated with significant improvement in overall quality of life, self-image, physical symptoms, and general health in studies assessing change in quality of life.  PHQ-9: Review Flowsheet       07/24/2024 01/13/2024 08/26/2023 05/10/2023  Depression screen PHQ 2/9  Decreased Interest 0 0 0 0  Down, Depressed, Hopeless 0 0 0 0  PHQ - 2 Score 0 0 0 0  Altered sleeping 0 0 1 1  Tired, decreased energy 0 0 1 1  Change in appetite 0 0 0 1  Feeling bad or failure about yourself  0 0 0 0  Trouble concentrating 0 0 0 0  Moving slowly or fidgety/restless 0 0 0 0  Suicidal thoughts 0 0 0 0  PHQ-9 Score 0 0 2 3  Difficult doing work/chores - Not difficult at all Not difficult at all Not difficult at all   Interpretation of Total Score  Total Score Depression Severity:   1-4 = Minimal depression, 5-9 = Mild depression, 10-14 = Moderate depression, 15-19 = Moderately severe depression, 20-27 = Severe depression   Psychosocial Evaluation and Intervention:  Psychosocial Evaluation - 07/12/24 1429       Psychosocial Evaluation & Interventions   Interventions Encouraged to exercise with the program  and follow exercise prescription;Relaxation education;Stress management education    Comments She can look to her mother, brother, daughter and son for support. She takes some medication for her history of depression.    Expected Outcomes Short: Start [facility] to help with mood. Long: Maintain a healthy mental state    Continue Psychosocial Services  Follow up required by staff          Psychosocial Re-Evaluation:   Psychosocial Discharge (Final Psychosocial Re-Evaluation):   Education: Education Goals: Education classes will be provided on a weekly basis, covering required topics. Participant will state understanding/return demonstration of topics presented.  Learning Barriers/Preferences:  Learning Barriers/Preferences - 07/12/24 1425       Learning Barriers/Preferences   Learning Barriers None    Learning Preferences None          General Pulmonary Education Topics:  Infection Prevention: - Provides verbal and written material to individual with discussion of infection control including proper hand washing and proper equipment cleaning during exercise session. Flowsheet Row Pulmonary Rehab from 07/24/2024 in [facility] Cardiac and Pulmonary Rehab  Date 07/24/24  Educator [facility]  Instruction Review Code 1- Verbalizes Understanding    Falls Prevention: - Provides verbal and written material to individual with discussion of falls prevention and safety. Flowsheet Row Pulmonary Rehab from 07/24/2024 in [facility] Cardiac and Pulmonary Rehab  Date 07/24/24  Educator [facility]  Instruction Review Code 1- Verbalizes Understanding    Chronic Lung Disease Review: -  Group verbal instruction with posters, models, PowerPoint presentations and videos,  to review new updates, new respiratory medications, new advancements in procedures and treatments. Providing information on websites and 800 numbers for continued self-education. Includes information about supplement oxygen , available portable oxygen  systems, continuous and intermittent flow rates, oxygen  safety, concentrators, and Medicare reimbursement for oxygen . Explanation of Pulmonary Drugs, including class, frequency, complications, importance of spacers, rinsing mouth after steroid MDI's, and proper cleaning methods for nebulizers. Review of basic lung anatomy and physiology related to function, structure, and complications of lung disease. Review of risk factors. Discussion about methods for diagnosing sleep apnea and types of masks and machines for OSA. Includes a review of the use of types of environmental controls: home humidity, furnaces, filters, dust mite/pet prevention, HEPA vacuums. Discussion about weather changes, air quality and the benefits of nasal washing. Instruction on Warning signs, infection symptoms, calling MD promptly, preventive modes, and value of vaccinations. Review of effective airway clearance, coughing and/or vibration techniques. Emphasizing that all should Create an Action Plan. Written material given at graduation. Flowsheet Row Pulmonary Rehab from 07/24/2024 in [facility]  Education need identified 07/24/24    AED/CPR: - Group verbal and written instruction with the use of models to demonstrate the basic use of the AED with the basic ABC's of resuscitation.    Anatomy and Cardiac Procedures: - Group verbal and visual presentation and models provide information about basic cardiac anatomy and function. Reviews the testing methods done to diagnose heart disease and the outcomes of the test results. Describes the treatment choices: Medical Management,  Angioplasty, or Coronary Bypass Surgery for treating various heart conditions including Myocardial Infarction, Angina, Valve Disease, and Cardiac Arrhythmias.  Written material given at graduation.   Medication Safety: - Group verbal and visual instruction to review commonly prescribed medications for heart and lung disease. Reviews the medication, class of the drug, and side effects. Includes the steps to properly store meds and maintain the prescription regimen.  Written material given at graduation.  Other: -Provides group and verbal instruction on various topics (see comments)   Knowledge Questionnaire Score:  Knowledge Questionnaire Score - 07/24/24 1508       Knowledge Questionnaire Score   Pre Score 13/18           Core Components/Risk Factors/Patient Goals at Admission:  Personal Goals and Risk Factors at Admission - 07/24/24 1506       Core Components/Risk Factors/Patient Goals on Admission    Weight Management Yes;Weight Loss    Intervention Weight Management: Develop a combined nutrition and exercise program designed to reach desired caloric intake, while maintaining appropriate intake of nutrient and fiber, sodium and fats, and appropriate energy expenditure required for the weight goal.;Weight Management: Provide education and appropriate resources to help participant work on and attain dietary goals.;Weight Management/Obesity: Establish reasonable short term and long term weight goals.    Admit Weight 139 lb 12.8 oz (63.4 kg)    Goal Weight: Short Term 134 lb (60.8 kg)    Goal Weight: Long Term 134 lb (60.8 kg)    Expected Outcomes Short Term: Continue to assess and modify interventions until short term weight is achieved;Weight Loss: Understanding of general recommendations for a balanced deficit meal plan, which promotes 1-2 lb weight loss per week and includes a negative energy balance of [phone] kcal/d;Understanding recommendations for meals to include 15-35%  energy as protein, 25-35% energy from fat, 35-60% energy from carbohydrates, less than 200mg  of dietary cholesterol, 20-35 gm of total fiber daily;Understanding of distribution of calorie intake throughout the day with the consumption of 4-5 meals/snacks    Tobacco Cessation Yes    Intervention Assist the participant in steps to quit. Provide individualized education and counseling about committing to Tobacco Cessation, relapse prevention, and pharmacological support that can be provided by physician.;Education officer, environmental, assist with locating and accessing local/national Quit Smoking programs, and support quit date choice.    Expected Outcomes Short Term: Will demonstrate readiness to quit, by selecting a quit date.;Long Term: Complete abstinence from all tobacco products for at least 12 months from quit date.;Short Term: Will quit all tobacco product use, adhering to prevention of relapse plan.    Improve shortness of breath with ADL's Yes    Intervention Provide education, individualized exercise plan and daily activity instruction to help decrease symptoms of SOB with activities of daily living.    Expected Outcomes Short Term: Improve cardiorespiratory fitness to achieve a reduction of symptoms when performing ADLs;Long Term: Be able to perform more ADLs without symptoms or delay the onset of symptoms    Hypertension Yes    Intervention Provide education on lifestyle modifcations including regular physical activity/exercise, weight management, moderate sodium restriction and increased consumption of fresh fruit, vegetables, and low fat dairy, alcohol moderation, and smoking cessation.;Monitor prescription use compliance.    Expected Outcomes Short Term: Continued assessment and intervention until BP is < 140/25mm HG in hypertensive participants. < 130/74mm HG in hypertensive participants with diabetes, heart failure or chronic kidney disease.;Long Term: Maintenance of blood pressure at goal  levels.          Education:Diabetes - Individual verbal and written instruction to review signs/symptoms of diabetes, desired ranges of glucose level fasting, after meals and with exercise. Acknowledge that pre and post exercise glucose checks will be done for 3 sessions at entry of program.   Know Your Numbers and Heart Failure: - Group verbal and visual instruction to discuss disease risk factors for cardiac and pulmonary disease and treatment options.  Reviews associated critical values for Overweight/Obesity, Hypertension, Cholesterol, and Diabetes.  Discusses basics of heart failure: signs/symptoms and treatments.  Introduces Heart Failure Zone chart for action plan for heart failure.  Written material given at graduation.   Core Components/Risk Factors/Patient Goals Review:    Core Components/Risk Factors/Patient Goals at Discharge (Final Review):    ITP Comments:  ITP Comments     Row Name 07/12/24 1432           ITP Comments Virtual Visit completed. Patient informed on EP and RD appointment and 6 Minute walk test. Patient also informed of patient health questionnaires on My Chart. Patient Verbalizes understanding. Visit diagnosis can be found in [facility] 06/22/2024.          Comments: Initial ITP

## 2024-08-01 ENCOUNTER — Encounter: Attending: Pulmonary Disease

## 2024-08-01 DIAGNOSIS — J4489 Other specified chronic obstructive pulmonary disease: Secondary | ICD-10-CM | POA: Diagnosis not present

## 2024-08-01 NOTE — Progress Notes (Signed)
 Daily Session Note  Patient Details  Name: Lisa Avila MRN: [id number] Date of Birth: 1956/02/26 Referring Provider:   Flowsheet Row Pulmonary Rehab from 07/27/2024 in [facility] Cardiac and Pulmonary Rehab  Referring Provider Dr. Leita Adie    Encounter Date: 08/01/2024  Check In:  Session Check In - 08/01/24 1057       Check-In   Supervising physician immediately available to respond to emergencies See telemetry face sheet for immediately available ER MD    Location [facility]-Cardiac & Pulmonary Rehab    Staff Present Burnard Davenport [facility] Dyane BS, ACSM CEP, Exercise Physiologist;Margaret Best, MS, Exercise Physiologist;Jason Elnor RDN,LDN;Meredith Tressa RN,BSN    Virtual Visit No    Medication changes reported     No    Fall or balance concerns reported    No    Tobacco Cessation No Change    Warm-up and Cool-down Performed on first and last piece of equipment    Resistance Training Performed Yes    VAD Patient? No    PAD/SET Patient? No      Pain Assessment   Currently in Pain? No/denies             Social History   Tobacco Use  Smoking Status Every Day   Current packs/day: 1.00   Average packs/day: 1 pack/day for 50.0 years (50.0 ttl pk-yrs)   Types: Cigarettes  Smokeless Tobacco Never  Tobacco Comments   15 cigarettes daily- khj 06/22/2024      Started smoking at 68 years old   Smoked 2 PPD at her heaviest    Goals Met:  Proper associated with RPD/PD & O2 Sat Independence with exercise equipment Using PLB without cueing & demonstrates good technique Exercise tolerated well No report of concerns or symptoms today Strength training completed today  Goals Unmet:  Not Applicable  Comments: Pt able to follow exercise prescription today without complaint.  Will continue to monitor for progression.    Dr. Oneil Pinal is Medical Director for [facility] Cardiac Rehabilitation.  Dr. Fuad Aleskerov is Medical Director for [facility] Pulmonary  Rehabilitation.

## 2024-08-03 ENCOUNTER — Ambulatory Visit

## 2024-08-03 ENCOUNTER — Ambulatory Visit: Admitting: Pulmonary Disease

## 2024-08-08 ENCOUNTER — Encounter

## 2024-08-08 DIAGNOSIS — J4489 Other specified chronic obstructive pulmonary disease: Secondary | ICD-10-CM | POA: Diagnosis not present

## 2024-08-08 NOTE — Progress Notes (Signed)
 Daily Session Note  Patient Details  Name: Lisa Avila MRN: [id number] Date of Birth: 11-16-1956 Referring Provider:   Flowsheet Row Pulmonary Rehab from 07/27/2024 in [facility] Cardiac and Pulmonary Rehab  Referring Provider Dr. Leita Adie    Encounter Date: 08/08/2024  Check In:  Session Check In - 08/08/24 1107       Check-In   Supervising physician immediately available to respond to emergencies See telemetry face sheet for immediately available ER MD    Location [facility]-Cardiac & Pulmonary Rehab    Staff Present Burnard Davenport RN,BSN,MPA;Maxon Conetta BS, Exercise Physiologist;Margaret Best, MS, Exercise Physiologist;Jason Elnor RDN,LDN    Virtual Visit No    Medication changes reported     No    Fall or balance concerns reported    No    Tobacco Cessation No Change    Warm-up and Cool-down Performed on first and last piece of equipment    Resistance Training Performed Yes    VAD Patient? No    PAD/SET Patient? No      Pain Assessment   Currently in Pain? No/denies             Social History   Tobacco Use  Smoking Status Every Day   Current packs/day: 1.00   Average packs/day: 1 pack/day for 50.0 years (50.0 ttl pk-yrs)   Types: Cigarettes  Smokeless Tobacco Never  Tobacco Comments   15 cigarettes daily- khj 06/22/2024      Started smoking at 68 years old   Smoked 2 PPD at her heaviest    Goals Met:  Proper associated with RPD/PD & O2 Sat Independence with exercise equipment Using PLB without cueing & demonstrates good technique Exercise tolerated well No report of concerns or symptoms today Strength training completed today  Goals Unmet:  Not Applicable  Comments: Pt able to follow exercise prescription today without complaint.  Will continue to monitor for progression.    Dr. Oneil Pinal is Medical Director for [facility] Cardiac Rehabilitation.  Dr. Fuad Aleskerov is Medical Director for [facility] Pulmonary Rehabilitation.

## 2024-08-10 ENCOUNTER — Encounter: Admitting: Emergency Medicine

## 2024-08-10 DIAGNOSIS — J4489 Other specified chronic obstructive pulmonary disease: Secondary | ICD-10-CM | POA: Diagnosis not present

## 2024-08-10 NOTE — Progress Notes (Signed)
 Daily Session Note  Patient Details  Name: Lisa Avila MRN: [id number] Date of Birth: October 05, 1956 Referring Provider:   Flowsheet Row Pulmonary Rehab from 07/27/2024 in [facility] Cardiac and Pulmonary Rehab  Referring Provider Dr. Leita Adie    Encounter Date: 08/10/2024  Check In:  Session Check In - 08/10/24 1150       Check-In   Supervising physician immediately available to respond to emergencies See telemetry face sheet for immediately available ER MD    Location [facility]-Cardiac & Pulmonary Rehab    Staff Present Othel Durand, RN, BSN, CCRP;Joseph Hood RCP,RRT,BSRT;Margaret Best, MS, Exercise Physiologist;Maxon Conetta BS, Exercise Physiologist;Jason Elnor RDN,LDN    Virtual Visit No    Medication changes reported     No    Fall or balance concerns reported    No    Tobacco Cessation No Change    Warm-up and Cool-down Performed on first and last piece of equipment    Resistance Training Performed Yes    VAD Patient? No    PAD/SET Patient? No      Pain Assessment   Currently in Pain? No/denies             Social History   Tobacco Use  Smoking Status Every Day   Current packs/day: 1.00   Average packs/day: 1 pack/day for 50.0 years (50.0 ttl pk-yrs)   Types: Cigarettes  Smokeless Tobacco Never  Tobacco Comments   15 cigarettes daily- khj 06/22/2024      Started smoking at 68 years old   Smoked 2 PPD at her heaviest    Goals Met:  Proper associated with RPD/PD & O2 Sat Independence with exercise equipment Using PLB without cueing & demonstrates good technique Exercise tolerated well No report of concerns or symptoms today Strength training completed today  Goals Unmet:  Not Applicable  Comments: Pt able to follow exercise prescription today without complaint.  Will continue to monitor for progression.   Dr. Oneil Pinal is Medical Director for [facility] Cardiac Rehabilitation.  Dr. Fuad Aleskerov is Medical Director for [facility] Pulmonary  Rehabilitation.

## 2024-08-15 ENCOUNTER — Ambulatory Visit

## 2024-08-17 ENCOUNTER — Ambulatory Visit

## 2024-08-22 ENCOUNTER — Encounter

## 2024-08-23 DIAGNOSIS — J4489 Other specified chronic obstructive pulmonary disease: Secondary | ICD-10-CM

## 2024-08-23 NOTE — Progress Notes (Signed)
 Pulmonary Individual Treatment Plan  Patient Details  Name: Lisa Avila MRN: [id number] Date of Birth: [birth year] Referring Provider:   Flowsheet Row Pulmonary Rehab from 07/27/2024 in [facility] Cardiac and Pulmonary Rehab  Referring Provider Dr. Leita Adie    Initial Encounter Date:  Flowsheet Row Pulmonary Rehab from 07/27/2024 in [facility] Cardiac and Pulmonary Rehab  Date 07/27/24    Visit Diagnosis: COPD with asthma (HCC)  Patient's Home Medications on Admission:  Current Outpatient Medications:    albuterol  (PROVENTIL ) (2.5 MG/3ML) 0.083% nebulizer solution, Take 3 mLs (2.5 mg total) by nebulization every 6 (six) hours as needed for wheezing or shortness of breath., Disp: 75 mL, Rfl: 12   albuterol  (VENTOLIN  HFA) 108 (90 Base) MCG/ACT inhaler, TAKE 2 PUFFS BY MOUTH EVERY 6 HOURS AS NEEDED FOR WHEEZE OR SHORTNESS OF BREATH, Disp: 6.7 each, Rfl: 6   budesonide-glycopyrrolate-formoterol  (BREZTRI  AEROSPHERE) 160-9-4.8 MCG/ACT AERO inhaler, Inhale 2 puffs into the lungs in the morning and at bedtime. (Patient not taking: Reported on 07/12/2024), Disp: , Rfl:    cyclobenzaprine (FLEXERIL) 10 MG tablet, Take 10 mg by mouth 3 (three) times daily as needed., Disp: , Rfl:    esomeprazole (NEXIUM) 20 MG capsule, Take 20 mg by mouth daily at 12 noon., Disp: , Rfl:    OXYGEN , Inhale 2.5 L into the lungs at bedtime., Disp: , Rfl:    predniSONE  (STERAPRED UNI-PAK 21 TAB) 10 MG (21) TBPK tablet, Take as directed in the package. (Patient not taking: Reported on 07/12/2024), Disp: 21 tablet, Rfl: 0   rOPINIRole (REQUIP) 0.5 MG tablet, Take 0.5 tablets by mouth as needed., Disp: , Rfl:    rosuvastatin  (CRESTOR ) 5 MG tablet, Take 1 tablet (5 mg total) by mouth daily., Disp: 90 tablet, Rfl: 1   sertraline  (ZOLOFT ) 50 MG tablet, Take 1 tablet (50 mg total) by mouth daily., Disp: 90 tablet, Rfl: 1   Spacer/Aero-Holding Chambers (AEROCHAMBER MV) inhaler, Use as instructed to aid with inhaled medications.,  Disp: 1 each, Rfl: 0   traMADol  (ULTRAM ) 50 MG tablet, Take 1 tablet (50 mg total) by mouth at bedtime., Disp: 90 tablet, Rfl: 1   TRELEGY ELLIPTA  200-62.5-25 MCG/ACT AEPB, Inhale 1 puff into the lungs daily., Disp: 60 each, Rfl: 10 No current facility-administered medications for this visit.  Facility-Administered Medications Ordered in Other Visits:    albuterol  (PROVENTIL ) (2.5 MG/3ML) 0.083% nebulizer solution 2.5 mg, 2.5 mg, Nebulization, Once, Tamea Dedra CROME, MD  Past Medical History: Past Medical History:  Diagnosis Date   Anxiety    Asthma    COPD (chronic obstructive pulmonary disease) (HCC)    Emphysema lung (HCC)    Fibroids    History of colonic polyps    Reflux    Restless leg syndrome     Tobacco Use: Social History   Tobacco Use  Smoking Status Every Day   Current packs/day: 1.00   Average packs/day: 1 pack/day for 50.0 years (50.0 ttl pk-yrs)   Types: Cigarettes  Smokeless Tobacco Never  Tobacco Comments   15 cigarettes daily- khj 06/22/2024      Started smoking at 68 years old   Smoked 2 PPD at her heaviest    Labs: Review Flowsheet       Latest Ref Rng & Units 03/19/2020 08/26/2023 01/13/2024  Labs for ITP Cardiac and Pulmonary Rehab  Cholestrol 100 - 199 mg/dL 785     765  822   LDL (calc) 0 - 99 mg/dL 857     844  101   HDL-C >39 mg/dL 50     60  59   Trlycerides 0 - 149 mg/dL 874     892  95     Details       This result is from an external source.          Pulmonary Assessment Scores:  Pulmonary Assessment Scores     Row Name 07/24/24 1510         ADL UCSD   ADL Phase Entry     SOB Score total 44     Rest 0     Walk 3     Stairs 1     Bath 2     Dress 2     Shop 0       CAT Score   CAT Score 22       mMRC Score   mMRC Score 3        UCSD: Self-administered rating of dyspnea associated with activities of daily living (ADLs) 6-point scale (0 = not at all to 5 = maximal or unable to do because of  breathlessness)  Scoring Scores range from 0 to 120.  Minimally important difference is 5 units  CAT: CAT can identify the health impairment of COPD patients and is better correlated with disease progression.  CAT has a scoring range of zero to 40. The CAT score is classified into four groups of low (less than 10), medium (10 - 20), high (21-30) and very high (31-40) based on the impact level of disease on health status. A CAT score over 10 suggests significant symptoms.  A worsening CAT score could be explained by an exacerbation, poor medication adherence, poor inhaler technique, or progression of COPD or comorbid conditions.  CAT MCID is 2 points  mMRC: mMRC (Modified Medical Research Council) Dyspnea Scale is used to assess the degree of baseline functional disability in patients of respiratory disease due to dyspnea. No minimal important difference is established. A decrease in score of 1 point or greater is considered a positive change.   Pulmonary Function Assessment:  Pulmonary Function Assessment - 07/12/24 1425       Breath   Shortness of Breath Yes;Limiting activity;Fear of Shortness of Breath          Exercise Target Goals: Exercise Program Goal: Individual exercise prescription set using results from initial 6 min walk test and THRR while considering  patient's activity barriers and safety.   Exercise Prescription Goal: Initial exercise prescription builds to 30-45 minutes a day of aerobic activity, 2-3 days per week.  Home exercise guidelines will be given to patient during program as part of exercise prescription that the participant will acknowledge.  Education: Aerobic Exercise: - Group verbal and visual presentation on the components of exercise prescription. Introduces F.I.T.T principle from ACSM for exercise prescriptions.  Reviews F.I.T.T. principles of aerobic exercise including progression. Written material provided at class time.   Education: Resistance  Exercise: - Group verbal and visual presentation on the components of exercise prescription. Introduces F.I.T.T principle from ACSM for exercise prescriptions  Reviews F.I.T.T. principles of resistance exercise including progression. Written material provided at class time. Flowsheet Row Pulmonary Rehab from 08/10/2024 in [facility] Cardiac and Pulmonary Rehab  Date 07/27/24  Educator MB  Instruction Review Code 1- [employer] Understanding     Education: Exercise & Equipment Safety: - Individual verbal instruction and demonstration of equipment use and safety with use of the equipment. Flowsheet Row Pulmonary Rehab from 08/10/2024  in [facility] Cardiac and Pulmonary Rehab  Date 07/24/24  Educator [facility]  Instruction Review Code 5- Refused Teaching    Education: Exercise Physiology & General Exercise Guidelines: - Group verbal and written instruction with models to review the exercise physiology of the cardiovascular system and associated critical values. Provides general exercise guidelines with specific guidelines to those with heart or lung disease.    Education: Flexibility, Balance, Mind/Body Relaxation: - Group verbal and visual presentation with interactive activity on the components of exercise prescription. Introduces F.I.T.T principle from ACSM for exercise prescriptions. Reviews F.I.T.T. principles of flexibility and balance exercise training including progression. Also discusses the mind body connection.  Reviews various relaxation techniques to help reduce and manage stress (i.e. Deep breathing, progressive muscle relaxation, and visualization). Balance handout provided to take home. Written material provided at class time. Flowsheet Row Pulmonary Rehab from 08/10/2024 in [facility] Cardiac and Pulmonary Rehab  Date 07/27/24  Educator MB  Instruction Review Code 1- Verbalizes Understanding    Activity Barriers & Risk Stratification:  Activity Barriers & Cardiac Risk Stratification - 07/27/24 1130        Activity Barriers & Cardiac Risk Stratification   Activity Barriers Back Problems;Deconditioning          6 Minute Walk:  6 Minute Walk     Row Name 07/27/24 1128         6 Minute Walk   Phase Initial     Distance 800 feet     Walk Time 4.22 minutes     # of Rest Breaks 3     MPH 2.15     METS 2.3     RPE 13     Perceived Dyspnea  3     VO2 Peak 8.05     Symptoms Yes (comment)     Comments SOB     Resting HR 86 bpm     Resting BP 136/72     Resting Oxygen  Saturation  91 %     Exercise Oxygen  Saturation  during 6 min walk 91 %     Max Ex. HR 92 bpm     Max Ex. BP 146/64     2 Minute Post BP 122/62       Interval HR   1 Minute HR 92     2 Minute HR 82     3 Minute HR 72     4 Minute HR 82     5 Minute HR 83     6 Minute HR 84     2 Minute Post HR 88     Interval Heart Rate? Yes       Interval Oxygen    Interval Oxygen ? Yes     Baseline Oxygen  Saturation % 91 %     1 Minute Oxygen  Saturation % 94 %     1 Minute Liters of Oxygen  0 L     2 Minute Oxygen  Saturation % 93 %     2 Minute Liters of Oxygen  0 L     3 Minute Oxygen  Saturation % 93 %     3 Minute Liters of Oxygen  0 L     4 Minute Oxygen  Saturation % 92 %     4 Minute Liters of Oxygen  0 L     5 Minute Oxygen  Saturation % 91 %     5 Minute Liters of Oxygen  0 L     6 Minute Oxygen  Saturation % 92 %     6 Minute  Liters of Oxygen  0 L     2 Minute Post Oxygen  Saturation % 96 %     2 Minute Post Liters of Oxygen  0 L       Oxygen  Initial Assessment:  Oxygen  Initial Assessment - 07/12/24 1424       Home Oxygen    Home Oxygen  Device Portable Concentrator;Home Concentrator    Sleep Oxygen  Prescription Continuous    Liters per minute 2.5    Home Exercise Oxygen  Prescription None    Home Resting Oxygen  Prescription None    Compliance with Home Oxygen  Use Yes      Initial 6 min Walk   Oxygen  Used None      Program Oxygen  Prescription   Program Oxygen  Prescription None      Intervention    Short Term Goals To learn and exhibit compliance with exercise, home and travel O2 prescription;To learn and understand importance of maintaining oxygen  saturations>88%;To learn and demonstrate proper use of respiratory medications;To learn and understand importance of monitoring SPO2 with pulse oximeter and demonstrate accurate use of the pulse oximeter.;To learn and demonstrate proper pursed lip breathing techniques or other breathing techniques.     Long  Term Goals Verbalizes importance of monitoring SPO2 with pulse oximeter and return demonstration;Exhibits proper breathing techniques, such as pursed lip breathing or other method taught during program session;Demonstrates proper use of MDI's;Compliance with respiratory medication;Maintenance of O2 saturations>88%;Exhibits compliance with exercise, home  and travel O2 prescription          Oxygen  Re-Evaluation:  Oxygen  Re-Evaluation     Row Name 07/27/24 1238             Goals/Expected Outcomes   Comments Reviewed PLB technique with pt.  Talked about how it works and it's importance in maintaining their exercise saturations.       Goals/Expected Outcomes Short: Become more profiecient at using PLB.   Long: Become independent at using PLB.          Oxygen  Discharge (Final Oxygen  Re-Evaluation):  Oxygen  Re-Evaluation - 07/27/24 1238       Goals/Expected Outcomes   Comments Reviewed PLB technique with pt.  Talked about how it works and it's importance in maintaining their exercise saturations.    Goals/Expected Outcomes Short: Become more profiecient at using PLB.   Long: Become independent at using PLB.          Initial Exercise Prescription:  Initial Exercise Prescription - 07/27/24 1100       Date of Initial Exercise RX and Referring Provider   Date 07/27/24    Referring Provider Dr. Leita Adie      Oxygen    Oxygen  Continuous    Liters 0L    Maintain Oxygen  Saturation 88% or higher      Recumbant Bike   Level 1     RPM 50    Watts 25    Minutes 15    METs 2.3      NuStep   Level 1    SPM 80    Minutes 15    METs 2.3      REL-XR   Level 1    Watts 25    Speed 50    Minutes 15    METs 2.3      T5 Nustep   Level 1    SPM 80    Minutes 15    METs 2.3      Biostep-RELP   Level 1    SPM 50  Minutes 15    METs 2.3      Track   Laps 20    Minutes 15    METs 2.3      Prescription Details   Duration Progress to 30 minutes of continuous aerobic without signs/symptoms of physical distress      Intensity   THRR 40-80% of Max Heartrate 113-140    Ratings of Perceived Exertion 11-13    Perceived Dyspnea 0-4      Progression   Progression Continue to progress workloads to maintain intensity without signs/symptoms of physical distress.      Resistance Training   Training Prescription Yes    Weight 2lb    Reps 10-15          Perform Capillary Blood Glucose checks as needed.  Exercise Prescription Changes:   Exercise Prescription Changes     Row Name 07/27/24 1100 08/18/24 1000           Response to Exercise   Blood Pressure (Admit) 136/72 124/70      Blood Pressure (Exercise) 146/64 128/66      Blood Pressure (Exit) 122/62 122/62      Heart Rate (Admit) 86 bpm 80 bpm      Heart Rate (Exercise) 92 bpm 97 bpm      Heart Rate (Exit) 88 bpm 82 bpm      Oxygen  Saturation (Admit) 91 % 94 %      Oxygen  Saturation (Exercise) 91 % 90 %      Oxygen  Saturation (Exit) 96 % 94 %      Rating of Perceived Exertion (Exercise) 13 13      Perceived Dyspnea (Exercise) 3 3      Symptoms SOB none      Comments results first 2 weeks of exercise      Duration -- Progress to 30 minutes of  aerobic without signs/symptoms of physical distress      Intensity -- THRR unchanged        Progression   Progression -- Continue to progress workloads to maintain intensity without signs/symptoms of physical distress.      Average METs -- 2.4        Resistance Training   Training  Prescription -- Yes      Weight -- 3lb      Reps -- 10-15        Interval Training   Interval Training -- No        Oxygen    Oxygen  -- Continuous      Liters -- 0L        Recumbant Bike   Level -- 1      Watts -- 15      Minutes -- 15      METs -- 3.25        NuStep   Level -- 2      Minutes -- 15      METs -- 2.6        Track   Laps -- 16      Minutes -- 15      METs -- 1.87        Oxygen    Maintain Oxygen  Saturation -- 88% or higher         Exercise Comments:   Exercise Comments     Row Name 07/27/24 1237           Exercise Comments First full day of exercise!  Patient was oriented to gym and equipment including  functions, settings, policies, and procedures.  Patient's individual exercise prescription and treatment plan were reviewed.  All starting workloads were established based on the results of the 6 minute walk test done at initial orientation visit.  The plan for exercise progression was also introduced and progression will be customized based on patient's performance and goals.          Exercise Goals and Review:   Exercise Goals     Row Name 07/27/24 1136             Exercise Goals   Increase Physical Activity Yes       Intervention Provide advice, education, support and counseling about physical activity/exercise needs.;Develop an individualized exercise prescription for aerobic and resistive training based on initial evaluation findings, risk stratification, comorbidities and participant's personal goals.       Expected Outcomes Short Term: Attend rehab on a regular basis to increase amount of physical activity.;Long Term: Exercising regularly at least 3-5 days a week.;Long Term: Add in home exercise to make exercise part of routine and to increase amount of physical activity.       Increase Strength and Stamina Yes       Intervention Provide advice, education, support and counseling about physical activity/exercise needs.;Develop an  individualized exercise prescription for aerobic and resistive training based on initial evaluation findings, risk stratification, comorbidities and participant's personal goals.       Expected Outcomes Short Term: Increase workloads from initial exercise prescription for resistance, speed, and METs.;Short Term: Perform resistance training exercises routinely during rehab and add in resistance training at home;Long Term: Improve cardiorespiratory fitness, muscular endurance and strength as measured by increased METs and functional capacity ( )       Able to understand and use rate of perceived exertion (RPE) scale Yes       Intervention Provide education and explanation on how to use RPE scale       Expected Outcomes Short Term: Able to use RPE daily in rehab to express subjective intensity level;Long Term:  Able to use RPE to guide intensity level when exercising independently       Able to understand and use Dyspnea scale Yes       Intervention Provide education and explanation on how to use Dyspnea scale       Expected Outcomes Short Term: Able to use Dyspnea scale daily in rehab to express subjective sense of shortness of breath during exertion;Long Term: Able to use Dyspnea scale to guide intensity level when exercising independently       Knowledge and understanding of Target Heart Rate Range (THRR) Yes       Intervention Provide education and explanation of THRR including how the numbers were predicted and where they are located for reference       Expected Outcomes Short Term: Able to state/look up THRR;Short Term: Able to use daily as guideline for intensity in rehab;Long Term: Able to use THRR to govern intensity when exercising independently       Able to check pulse independently Yes       Intervention Provide education and demonstration on how to check pulse in carotid and radial arteries.;Review the importance of being able to check your own pulse for safety during independent exercise        Expected Outcomes Short Term: Able to explain why pulse checking is important during independent exercise;Long Term: Able to check pulse independently and accurately       Understanding of Exercise Prescription  Yes       Intervention Provide education, explanation, and written materials on patient's individual exercise prescription       Expected Outcomes Short Term: Able to explain program exercise prescription;Long Term: Able to explain home exercise prescription to exercise independently          Exercise Goals Re-Evaluation :  Exercise Goals Re-Evaluation     Row Name 07/27/24 1237 08/18/24 1023           Exercise Goal Re-Evaluation   Exercise Goals Review Increase Physical Activity;Able to understand and use rate of perceived exertion (RPE) scale;Knowledge and understanding of Target Heart Rate Range (THRR);Understanding of Exercise Prescription;Increase Strength and Stamina;Able to check pulse independently;Able to understand and use Dyspnea scale Increase Physical Activity;Increase Strength and Stamina;Understanding of Exercise Prescription      Comments Reviewed RPE and dyspnea scale, THR and program prescription with pt today.  Pt voiced understanding and was given a copy of goals to take home. Angelette is off to a good start in the program, and was able to attend her first 3 sessions during this review period. During the sessions she was able to increase her handweights from 2lbs to 3lbs. She was also able to use the T4 nustep at level 2 and walk 16 laps on the track. We will continue to monitor her progress in the program.      Expected Outcomes Short: Use RPE daily to regulate intensity.  Long: Follow program prescription in THR. Short: Continue to follow exercise prescription. Long: Continue exercise to improve strength and stamina.         Discharge Exercise Prescription (Final Exercise Prescription Changes):  Exercise Prescription Changes - 08/18/24 1000       Response to  Exercise   Blood Pressure (Admit) 124/70    Blood Pressure (Exercise) 128/66    Blood Pressure (Exit) 122/62    Heart Rate (Admit) 80 bpm    Heart Rate (Exercise) 97 bpm    Heart Rate (Exit) 82 bpm    Oxygen  Saturation (Admit) 94 %    Oxygen  Saturation (Exercise) 90 %    Oxygen  Saturation (Exit) 94 %    Rating of Perceived Exertion (Exercise) 13    Perceived Dyspnea (Exercise) 3    Symptoms none    Comments first 2 weeks of exercise    Duration Progress to 30 minutes of  aerobic without signs/symptoms of physical distress    Intensity THRR unchanged      Progression   Progression Continue to progress workloads to maintain intensity without signs/symptoms of physical distress.    Average METs 2.4      Resistance Training   Training Prescription Yes    Weight 3lb    Reps 10-15      Interval Training   Interval Training No      Oxygen    Oxygen  Continuous    Liters 0L      Recumbant Bike   Level 1    Watts 15    Minutes 15    METs 3.25      NuStep   Level 2    Minutes 15    METs 2.6      Track   Laps 16    Minutes 15    METs 1.87      Oxygen    Maintain Oxygen  Saturation 88% or higher          Nutrition:  Target Goals: Understanding of nutrition guidelines, daily intake of sodium 1500mg , cholesterol <  200mg , calories 30% from fat and 7% or less from saturated fats, daily to have 5 or more servings of fruits and vegetables.  Education: Nutrition 1 -Group instruction provided by verbal, written material, interactive activities, discussions, models, and posters to present general guidelines for heart healthy nutrition including macronutrients, label reading, and promoting whole foods over processed counterparts. Education serves as Pensions consultant of discussion of heart healthy eating for all. Written material provided at class time. Flowsheet Row Pulmonary Rehab from 08/10/2024 in [facility] Cardiac and Pulmonary Rehab  Date 08/10/24  Educator JG  Instruction Review Code  1- [employer] Understanding      Education: Nutrition 2 -Group instruction provided by verbal, written material, interactive activities, discussions, models, and posters to present general guidelines for heart healthy nutrition including sodium, cholesterol, and saturated fat. Providing guidance of habit forming to improve blood pressure, cholesterol, and body weight. Written material provided at class time.     Biometrics:  Pre Biometrics - 07/27/24 1136       Pre Biometrics   Height 5' 2.3 (1.582 m)    Weight 138 lb 9.3 oz (62.9 kg)    Waist Circumference 36 inches    Hip Circumference 40 inches    Waist to Hip Ratio 0.9 %    BMI (Calculated) 25.12    Single Leg Stand 30 seconds           Nutrition Therapy Plan and Nutrition Goals:  Nutrition Therapy & Goals - 07/24/24 1506       Nutrition Therapy   RD appointment deferred Yes          Nutrition Assessments:  MEDIFICTS Score Key: >=70 Need to make dietary changes  40-70 Heart Healthy Diet <= 40 Therapeutic Level Cholesterol Diet  Flowsheet Row Pulmonary Rehab from 07/24/2024 in [facility] Cardiac and Pulmonary Rehab  Picture Your Plate Total Score on Admission 52   Picture Your Plate Scores: <59 Unhealthy dietary pattern with much room for improvement. 41-50 Dietary pattern unlikely to meet recommendations for good health and room for improvement. 51-60 More healthful dietary pattern, with some room for improvement.  >60 Healthy dietary pattern, although there may be some specific behaviors that could be improved.   Nutrition Goals Re-Evaluation:   Nutrition Goals Discharge (Final Nutrition Goals Re-Evaluation):   Psychosocial: Target Goals: Acknowledge presence or absence of significant depression and/or stress, maximize coping skills, provide positive support system. Participant is able to verbalize types and ability to use techniques and skills needed for reducing stress and depression.   Education:  Stress, Anxiety, and Depression - Group verbal and visual presentation to define topics covered.  Reviews how body is impacted by stress, anxiety, and depression.  Also discusses healthy ways to reduce stress and to treat/manage anxiety and depression.  Written material provided at class time.   Education: Sleep Hygiene -Provides group verbal and written instruction about how sleep can affect your health.  Define sleep hygiene, discuss sleep cycles and impact of sleep habits. Review good sleep hygiene tips.    Initial Review & Psychosocial Screening:  Initial Psych Review & Screening - 07/12/24 1427       Initial Review   Current issues with Current Psychotropic Meds;History of Depression      Family Dynamics   Good Support System? Yes    Comments She can look to her mother, brother, daughter and son for support. She takes some medication for her history of depression.      Barriers   Psychosocial barriers to  participate in program The patient should benefit from training in stress management and relaxation.      Screening Interventions   Interventions Encouraged to exercise;Provide feedback about the scores to participant;To provide support and resources with identified psychosocial needs    Expected Outcomes Short Term goal: Utilizing psychosocial counselor, staff and physician to assist with identification of specific Stressors or current issues interfering with healing process. Setting desired goal for each stressor or current issue identified.;Long Term Goal: Stressors or current issues are controlled or eliminated.;Short Term goal: Identification and review with participant of any Quality of Life or Depression concerns found by scoring the questionnaire.;Long Term goal: The participant improves quality of Life and PHQ9 Scores as seen by post scores and/or verbalization of changes          Quality of Life Scores:  Scores of 19 and below usually indicate a poorer quality of life  in these areas.  A difference of  2-3 points is a clinically meaningful difference.  A difference of 2-3 points in the total score of the Quality of Life Index has been associated with significant improvement in overall quality of life, self-image, physical symptoms, and general health in studies assessing change in quality of life.  PHQ-9: Review Flowsheet       07/24/2024 01/13/2024 08/26/2023 05/10/2023  Depression screen PHQ 2/9  Decreased Interest 0 0 0 0  Down, Depressed, Hopeless 0 0 0 0  PHQ - 2 Score 0 0 0 0  Altered sleeping 0 0 1 1  Tired, decreased energy 0 0 1 1  Change in appetite 0 0 0 1  Feeling bad or failure about yourself  0 0 0 0  Trouble concentrating 0 0 0 0  Moving slowly or fidgety/restless 0 0 0 0  Suicidal thoughts 0 0 0 0  PHQ-9 Score 0 0 2 3  Difficult doing work/chores - Not difficult at all Not difficult at all Not difficult at all   Interpretation of Total Score  Total Score Depression Severity:  1-4 = Minimal depression, 5-9 = Mild depression, 10-14 = Moderate depression, 15-19 = Moderately severe depression, 20-27 = Severe depression   Psychosocial Evaluation and Intervention:  Psychosocial Evaluation - 07/12/24 1429       Psychosocial Evaluation & Interventions   Interventions Encouraged to exercise with the program and follow exercise prescription;Relaxation education;Stress management education    Comments She can look to her mother, brother, daughter and son for support. She takes some medication for her history of depression.    Expected Outcomes Short: Start [facility] to help with mood. Long: Maintain a healthy mental state    Continue Psychosocial Services  Follow up required by staff          Psychosocial Re-Evaluation:   Psychosocial Discharge (Final Psychosocial Re-Evaluation):   Education: Education Goals: Education classes will be provided on a weekly basis, covering required topics. Participant will state understanding/return  demonstration of topics presented.  Learning Barriers/Preferences:  Learning Barriers/Preferences - 07/12/24 1425       Learning Barriers/Preferences   Learning Barriers None    Learning Preferences None          General Pulmonary Education Topics:  Infection Prevention: - Provides verbal and written material to individual with discussion of infection control including proper hand washing and proper equipment cleaning during exercise session. Flowsheet Row Pulmonary Rehab from 08/10/2024 in [facility] Card[facility] Pul[facility]  Date 07/24/24  Educator [facility]  Instruction Review Code 1- [employer]  Falls Prevention: - Provides verbal and written material to individual with discussion of falls prevention and safety. Flowsheet Row Pulmonary Rehab from 08/10/2024 in [facility] Cardiac and Pulmonary Rehab  Date 07/24/24  Educator [facility]  Instruction Review Code 1- Verbalizes Understanding    Chronic Lung Disease Review: - Group verbal instruction with posters, models, PowerPoint presentations and videos,  to review new updates, new respiratory medications, new advancements in procedures and treatments. Providing information on websites and 800 numbers for continued self-education. Includes information about supplement oxygen , available portable oxygen  systems, continuous and intermittent flow rates, oxygen  safety, concentrators, and Medicare reimbursement for oxygen . Explanation of Pulmonary Drugs, including class, frequency, complications, importance of spacers, rinsing mouth after steroid MDI's, and proper cleaning methods for nebulizers. Review of basic lung anatomy and physiology related to function, structure, and complications of lung disease. Review of risk factors. Discussion about methods for diagnosing sleep apnea and types of masks and machines for OSA. Includes a review of the use of types of environmental controls: home humidity, furnaces, filters, dust mite/pet prevention,  HEPA vacuums. Discussion about weather changes, air quality and the benefits of nasal washing. Instruction on Warning signs, infection symptoms, calling MD promptly, preventive modes, and value of vaccinations. Review of effective airway clearance, coughing and/or vibration techniques. Emphasizing that all should Create an Action Plan. Written material provided at class time. Flowsheet Row Pulmonary Rehab from 08/10/2024 in [facility] Card[facility] Pulmonary [facility]hab  Education need identified 07/24/24    AED/CPR: - Group verbal and written instruction with the use of models to demonstrate the basic use of the AED with the basic ABC's of resuscitation.    Tests and Procedures:  - Group verbal and visual presentation and models provide information about basic cardiac anatomy and function. Reviews the testing methods done to diagnose heart disease and the outcomes of the test results. Describes the treatment choices: Medical Management, Angioplasty, or Coronary Bypass Surgery for treating various heart conditions including Myocardial Infarction, Angina, Valve Disease, and Cardiac Arrhythmias.  Written material provided at class time.   Medication Safety: - Group verbal and visual instruction to review commonly prescribed medications for heart and lung disease. Reviews the medication, class of the drug, and side effects. Includes the steps to properly store meds and maintain the prescription regimen.  Written material given at graduation.   Other: -Provides group and verbal instruction on various topics (see comments)   Knowledge Questionnaire Score:  Knowledge Questionnaire Score - 07/24/24 1508       Knowledge Questionnaire Score   Pre Score 13/18           Core Components/Risk Factors/Patient Goals at Admission:  Personal Goals and Risk Factors at Admission - 07/24/24 1506       Core Components/Risk Factors/Patient Goals on Admission    Weight Management Yes;Weight Loss    Intervention  Weight Management: Develop a combined nutrition and exercise program designed to reach desired caloric intake, while maintaining appropriate intake of nutrient and fiber, sodium and fats, and appropriate energy expenditure required for the weight goal.;Weight Management: Provide education and appropriate resources to help participant work on and attain dietary goals.;Weight Management/Obesity: Establish reasonable short term and long term weight goals.    Admit Weight 139 lb 12.8 oz (63.4 kg)    Goal Weight: Short Term 134 lb (60.8 kg)    Goal Weight: Long Term 134 lb (60.8 kg)    Expected Outcomes Short Term: Continue to assess and modify interventions until short term weight is  achieved;Weight Loss: Understanding of general recommendations for a balanced deficit meal plan, which promotes 1-2 lb weight loss per week and includes a negative energy balance of [phone] kcal/d;Understanding recommendations for meals to include 15-35% energy as protein, 25-35% energy from fat, 35-60% energy from carbohydrates, less than 200mg  of dietary cholesterol, 20-35 gm of total fiber daily;Understanding of distribution of calorie intake throughout the day with the consumption of 4-5 meals/snacks    Tobacco Cessation Yes    Intervention Assist the participant in steps to quit. Provide individualized education and counseling about committing to Tobacco Cessation, relapse prevention, and pharmacological support that can be provided by physician.;Education officer, environmental, assist with locating and accessing local/national Quit Smoking programs, and support quit date choice.    Expected Outcomes Short Term: Will demonstrate readiness to quit, by selecting a quit date.;Long Term: Complete abstinence from all tobacco products for at least 12 months from quit date.;Short Term: Will quit all tobacco product use, adhering to prevention of relapse plan.    Improve shortness of breath with ADL's Yes    Intervention Provide  education, individualized exercise plan and daily activity instruction to help decrease symptoms of SOB with activities of daily living.    Expected Outcomes Short Term: Improve cardiorespiratory fitness to achieve a reduction of symptoms when performing ADLs;Long Term: Be able to perform more ADLs without symptoms or delay the onset of symptoms    Hypertension Yes    Intervention Provide education on lifestyle modifcations including regular physical activity/exercise, weight management, moderate sodium restriction and increased consumption of fresh fruit, vegetables, and low fat dairy, alcohol moderation, and smoking cessation.;Monitor prescription use compliance.    Expected Outcomes Short Term: Continued assessment and intervention until BP is < 140/77mm HG in hypertensive participants. < 130/21mm HG in hypertensive participants with diabetes, heart failure or chronic kidney disease.;Long Term: Maintenance of blood pressure at goal levels.          Education:Diabetes - Individual verbal and written instruction to review signs/symptoms of diabetes, desired ranges of glucose level fasting, after meals and with exercise. Acknowledge that pre and post exercise glucose checks will be done for 3 sessions at entry of program.   Know Your Numbers and Heart Failure: - Group verbal and visual instruction to discuss disease risk factors for cardiac and pulmonary disease and treatment options.  Reviews associated critical values for Overweight/Obesity, Hypertension, Cholesterol, and Diabetes.  Discusses basics of heart failure: signs/symptoms and treatments.  Introduces Heart Failure Zone chart for action plan for heart failure. Written material provided at class time.   Core Components/Risk Factors/Patient Goals Review:    Core Components/Risk Factors/Patient Goals at Discharge (Final Review):    ITP Comments:  ITP Comments     Row Name 07/12/24 1432 07/27/24 1237 08/23/24 0818       ITP  Comments Virtual Visit completed. Patient informed on EP and RD appointment and 6 Minute walk test. Patient also informed of patient health questionnaires on My Chart. Patient Verbalizes understanding. Visit diagnosis can be found in [facility] 06/22/2024. First full day of exercise!  Patient was oriented to gym and equipment including functions, settings, policies, and procedures.  Patient's individual exercise prescription and treatment plan were reviewed.  All starting workloads were established based on the results of the 6 minute walk test done at initial orientation visit.  The plan for exercise progression was also introduced and progression will be customized based on patient's performance and goals. 30 Day review completed. Medical Director ITP review  done; changes made as directed and signed approval by Medical Director. New to program.        Comments: 30 day review

## 2024-08-24 ENCOUNTER — Ambulatory Visit

## 2024-08-29 ENCOUNTER — Encounter: Attending: Pulmonary Disease

## 2024-08-29 ENCOUNTER — Encounter: Payer: Self-pay | Admitting: Internal Medicine

## 2024-08-29 DIAGNOSIS — J4489 Other specified chronic obstructive pulmonary disease: Secondary | ICD-10-CM | POA: Insufficient documentation

## 2024-08-29 NOTE — Progress Notes (Deleted)
 Date:  08/29/2024   Name:  Lisa Avila   DOB:  09/03/1956   MRN:  [id number]   Chief Complaint: No chief complaint on file. Lisa Avila is a 68 y.o. female who presents today for her Complete Annual Exam. She feels {DESC; WELL/FAIRLY WELL/POORLY:18703}. She reports exercising ***. She reports she is sleeping {DESC; WELL/FAIRLY WELL/POORLY:18703}. Breast complaints ***.  Health Maintenance  Topic Date Due   Medicare Annual Wellness Visit  Never done   Zoster (Shingles) Vaccine (1 of 2) Never done   DEXA scan (bone density measurement)  Never done   Mammogram  07/06/2024   Flu Shot  07/28/2024   COVID-19 Vaccine (3 - 2025-26 season) 08/28/2024   Screening for Lung Cancer  06/15/2025   DTaP/Tdap/Td vaccine (2 - Td or Tdap) 08/11/2026   Colon Cancer Screening  05/24/2028   Pneumococcal Vaccine for age over 31  Completed   Hepatitis C Screening  Completed   HPV Vaccine  Aged Out   Meningitis B Vaccine  Aged Out    Hyperlipidemia Associated symptoms include shortness of breath. Pertinent negatives include no chest pain or myalgias.  Depression        Associated symptoms include no fatigue, no myalgias and no headaches. Gastroesophageal Reflux She reports no abdominal pain, no chest pain, no coughing or no wheezing. Pertinent negatives include no fatigue.  COPD -   RLS -  Review of Systems  Constitutional:  Negative for fatigue and unexpected weight change.  HENT:  Negative for trouble swallowing.   Eyes:  Negative for visual disturbance.  Respiratory:  Positive for chest tightness and shortness of breath. Negative for cough and wheezing.   Cardiovascular:  Negative for chest pain, palpitations and leg swelling.  Gastrointestinal:  Negative for abdominal pain, constipation and diarrhea.  Genitourinary:  Negative for frequency and urgency.  Musculoskeletal:  Negative for arthralgias and myalgias.  Neurological:  Negative for dizziness, weakness, light-headedness and  headaches.  Psychiatric/Behavioral:  Positive for depression and sleep disturbance. Negative for dysphoric mood. The patient is not nervous/anxious.      Lab Results  Component Value Date   NA 142 01/13/2024   K 4.1 01/13/2024   CO2 25 01/13/2024   GLUCOSE 126 (H) 01/13/2024   BUN 10 01/13/2024   CREATININE 0.83 01/13/2024   CALCIUM  9.5 01/13/2024   EGFR 78 01/13/2024   Lab Results  Component Value Date   CHOL 177 01/13/2024   HDL 59 01/13/2024   LDLCALC 101 (H) 01/13/2024   TRIG 95 01/13/2024   CHOLHDL 3.0 01/13/2024   Lab Results  Component Value Date   TSH 2.190 08/26/2023   No results found for: HGBA1C Lab Results  Component Value Date   WBC 6.2 08/26/2023   HGB 15.0 08/26/2023   HCT 46.8 (H) 08/26/2023   MCV 93 08/26/2023   PLT 243 08/26/2023   Lab Results  Component Value Date   ALT 15 01/13/2024   AST 19 01/13/2024   ALKPHOS 96 01/13/2024   BILITOT 0.2 01/13/2024   No results found for: MARIEN BOLLS, VD25OH   Patient Active Problem List   Diagnosis Date Noted   Mixed hyperlipidemia 08/27/2023   Adenomatous polyp of colon 05/25/2023   Restless leg syndrome 05/10/2023   Major depression single episode, in partial remission (HCC) 05/10/2023   Nocturnal hypoxemia due to emphysema (HCC) 05/10/2023   Stage 3 severe COPD by GOLD classification (HCC) 02/26/2014   Severe tobacco use disorder 02/26/2014  No Known Allergies  Past Surgical History:  Procedure Laterality Date   CHOLECYSTECTOMY  2010   COLONOSCOPY WITH PROPOFOL  N/A 05/25/2023   Procedure: COLONOSCOPY WITH BIOPSY;  Surgeon: Unk Corinn Skiff, MD;  Location: [facility];  Service: Endoscopy;  Laterality: N/A;   POLYPECTOMY  05/25/2023   Procedure: POLYPECTOMY;  Surgeon: Unk Corinn Skiff, MD;  Location: [facility];  Service: Endoscopy;;  CLIP X 1 PLACED AT ASCENDING COLON POLYP REMOVAL SITE   TUBAL LIGATION  1983    Social History   Tobacco Use    Smoking status: Every Day    Current packs/day: 1.00    Average packs/day: 1 pack/day for 50.0 years (50.0 ttl pk-yrs)    Types: Cigarettes   Smokeless tobacco: Never   Tobacco comments:    15 cigarettes daily- khj 06/22/2024        Started smoking at 68 years old    Smoked 2 PPD at her heaviest  Vaping Use   Vaping status: Never Used  Substance Use Topics   Alcohol use: No    Alcohol/week: 0.0 standard drinks of alcohol   Drug use: No     Medication list has been reviewed and updated.  No outpatient medications have been marked as taking for the 08/29/24 encounter (Appointment) with Justus Leita DEL, MD.       01/13/2024    9:38 AM 08/26/2023   11:03 AM 05/10/2023    2:23 PM  GAD 7 : Generalized Anxiety Score  Nervous, Anxious, on Edge 0 0 1  Control/stop worrying 0 0 1  Worry too much - different things 0 0 1  Trouble relaxing 0 0 0  Restless 0 0 0  Easily annoyed or irritable 0 0 0  Afraid - awful might happen 0 0 0  Total GAD 7 Score 0 0 3  Anxiety Difficulty Not difficult at all Not difficult at all Not difficult at all       07/24/2024    3:11 PM 01/13/2024    9:37 AM 08/26/2023   11:03 AM  Depression screen PHQ 2/9  Decreased Interest 0 0 0  Down, Depressed, Hopeless 0 0 0  PHQ - 2 Score 0 0 0  Altered sleeping 0 0 1  Tired, decreased energy 0 0 1  Change in appetite 0 0 0  Feeling bad or failure about yourself  0 0 0  Trouble concentrating 0 0 0  Moving slowly or fidgety/restless 0 0 0  Suicidal thoughts 0 0 0  PHQ-9 Score 0 0 2  Difficult doing work/chores  Not difficult at all Not difficult at all    BP Readings from Last 3 Encounters:  06/22/24 110/70  05/04/24 120/82  03/28/24 120/82    Physical Exam  Wt Readings from Last 3 Encounters:  07/27/24 138 lb 9.3 oz (62.9 kg)  06/22/24 138 lb 6.4 oz (62.8 kg)  05/04/24 139 lb 3.2 oz (63.1 kg)    There were no vitals taken for this visit.  Assessment and Plan:  Problem List Items Addressed  This Visit       Unprioritized   Stage 3 severe COPD by GOLD classification (HCC)   Restless leg syndrome   Major depression single episode, in partial remission (HCC)   Mixed hyperlipidemia   Other Visit Diagnoses       Annual physical exam    -  Primary     Encounter for screening mammogram for breast cancer  Encounter for screening for osteoporosis         Elevated serum glucose           No follow-ups on file.    Leita HILARIO Adie, MD [facility] [place]

## 2024-08-30 ENCOUNTER — Telehealth: Payer: Self-pay

## 2024-08-30 NOTE — Telephone Encounter (Signed)
 We called Pt today as she has not attended rehab since 08/10/2024. There was no answer. We left a message requesting that she call us  back to update us  on her status in the program.

## 2024-08-31 ENCOUNTER — Ambulatory Visit

## 2024-08-31 ENCOUNTER — Telehealth: Payer: Self-pay

## 2024-08-31 NOTE — Telephone Encounter (Signed)
 Lisa Avila called today to inform us  that she has been absent from rehab due to her recent breathing issues. She plans to return to rehab on 09/05/2024.

## 2024-09-05 ENCOUNTER — Ambulatory Visit

## 2024-09-07 ENCOUNTER — Encounter

## 2024-09-10 ENCOUNTER — Other Ambulatory Visit: Payer: Self-pay | Admitting: Internal Medicine

## 2024-09-10 DIAGNOSIS — E782 Mixed hyperlipidemia: Secondary | ICD-10-CM

## 2024-09-11 ENCOUNTER — Ambulatory Visit
Admission: RE | Admit: 2024-09-11 | Discharge: 2024-09-11 | Disposition: A | Discharge status: Home / Self Care | Source: Ambulatory Visit | Attending: Pulmonary Disease | Admitting: Pulmonary Disease

## 2024-09-11 ENCOUNTER — Ambulatory Visit (INDEPENDENT_AMBULATORY_CARE_PROVIDER_SITE_OTHER): Admitting: Pulmonary Disease

## 2024-09-11 ENCOUNTER — Encounter: Payer: Self-pay | Admitting: Pulmonary Disease

## 2024-09-11 ENCOUNTER — Other Ambulatory Visit: Payer: Self-pay

## 2024-09-11 ENCOUNTER — Other Ambulatory Visit
Admission: RE | Admit: 2024-09-11 | Discharge: 2024-09-11 | Disposition: A | Discharge status: Home / Self Care | Source: Ambulatory Visit | Attending: Pulmonary Disease | Admitting: Pulmonary Disease

## 2024-09-11 VITALS — BP 120/82 | HR 84 | Temp 97.6°F | Ht 62.5 in | Wt 137.0 lb

## 2024-09-11 DIAGNOSIS — J209 Acute bronchitis, unspecified: Secondary | ICD-10-CM

## 2024-09-11 DIAGNOSIS — R0602 Shortness of breath: Secondary | ICD-10-CM | POA: Insufficient documentation

## 2024-09-11 DIAGNOSIS — J449 Chronic obstructive pulmonary disease, unspecified: Secondary | ICD-10-CM

## 2024-09-11 DIAGNOSIS — G4736 Sleep related hypoventilation in conditions classified elsewhere: Secondary | ICD-10-CM

## 2024-09-11 DIAGNOSIS — F17210 Nicotine dependence, cigarettes, uncomplicated: Secondary | ICD-10-CM

## 2024-09-11 DIAGNOSIS — J440 Chronic obstructive pulmonary disease with acute lower respiratory infection: Secondary | ICD-10-CM | POA: Insufficient documentation

## 2024-09-11 DIAGNOSIS — R918 Other nonspecific abnormal finding of lung field: Secondary | ICD-10-CM

## 2024-09-11 DIAGNOSIS — J4489 Other specified chronic obstructive pulmonary disease: Secondary | ICD-10-CM

## 2024-09-11 DIAGNOSIS — J441 Chronic obstructive pulmonary disease with (acute) exacerbation: Secondary | ICD-10-CM

## 2024-09-11 DIAGNOSIS — H539 Unspecified visual disturbance: Secondary | ICD-10-CM

## 2024-09-11 DIAGNOSIS — R519 Headache, unspecified: Secondary | ICD-10-CM

## 2024-09-11 DIAGNOSIS — J9801 Acute bronchospasm: Secondary | ICD-10-CM

## 2024-09-11 LAB — CBC WITH DIFFERENTIAL/PLATELET
Abs Immature Granulocytes: 0.01 K/uL (ref 0.00–0.07)
Basophils Absolute: 0 K/uL (ref 0.0–0.1)
Basophils Relative: 1 %
Eosinophils Absolute: 0.2 K/uL (ref 0.0–0.5)
Eosinophils Relative: 3 %
HCT: 45.6 % (ref 36.0–46.0)
Hemoglobin: 15 g/dL (ref 12.0–15.0)
Immature Granulocytes: 0 %
Lymphocytes Relative: 29 %
Lymphs Abs: 1.6 K/uL (ref 0.7–4.0)
MCH: 31 pg (ref 26.0–34.0)
MCHC: 32.9 g/dL (ref 30.0–36.0)
MCV: 94.2 fL (ref 80.0–100.0)
Monocytes Absolute: 0.4 K/uL (ref 0.1–1.0)
Monocytes Relative: 7 %
Neutro Abs: 3.3 K/uL (ref 1.7–7.7)
Neutrophils Relative %: 60 %
Platelets: 227 K/uL (ref 150–400)
RBC: 4.84 MIL/uL (ref 3.87–5.11)
RDW: 12.2 % (ref 11.5–15.5)
WBC: 5.4 K/uL (ref 4.0–10.5)
nRBC: 0 % (ref 0.0–0.2)

## 2024-09-11 MED ORDER — AZITHROMYCIN 250 MG PO TABS: ORAL_TABLET | ORAL | 0 refills | Status: DC

## 2024-09-11 MED ORDER — METHYLPREDNISOLONE 4 MG PO TBPK: ORAL_TABLET | ORAL | 0 refills | Status: DC

## 2024-09-11 NOTE — Patient Instructions (Signed)
 VISIT SUMMARY:  Today, we discussed your chronic obstructive pulmonary disease (COPD) and ongoing tobacco use. You reported increased fatigue, difficulty breathing, headaches, and visual disturbances over the past month. We reviewed your current medications and previous attempts to quit smoking.  YOUR PLAN:  -CHRONIC OBSTRUCTIVE PULMONARY DISEASE (COPD) EXACERBATION: COPD exacerbation means a worsening of your COPD symptoms, such as increased wheezing and fatigue. This can be due to ongoing smoking or an infection. We will order a chest X-ray and blood work to check for any infections. You will start a Medrol  taper pack and an antibiotic with anti-inflammatory properties to help reduce inflammation and treat any possible infection.  -TOBACCO USE DISORDER: Tobacco use disorder means you are having difficulty quitting smoking despite previous attempts. We discussed strategies to help you quit, including reducing your access to cigarettes by keeping them outside. Quitting smoking is crucial for managing your COPD.  INSTRUCTIONS:  Please get a chest X-ray and blood work done as soon as possible. Start taking the Medrol  taper pack and the prescribed antibiotic as directed. Continue to work on smoking cessation by reducing your access to cigarettes. Follow up with us  if your symptoms do not improve or if you have any new concerns.

## 2024-09-11 NOTE — Progress Notes (Signed)
 Subjective:    Patient ID: Lisa Avila, female    DOB: March 25, 1956, 68 y.o.   MRN: [id number]  Patient Care Team: Justus Leita DEL, MD as PCP - General (Internal Medicine) Tamea Dedra CROME, MD as Consulting Physician (Pulmonary Disease)  Chief Complaint  Patient presents with   COPD    Cough. Shortness of breath on exertion and wheezing.     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  She also has multiple lung nodules.  The patient was last seen here on 22 June 2024.  She has been evaluated for multiple lung nodules.  PET/CT obtained 13 March 2024 was favoring infectious/inflammatory etiology for multiple lung nodules.  The patient subsequently grew Serratia out of the sputum and was treated.  Imaging after treatment showed marked improvement.  This is a scheduled follow-up visit.   HPI Discussed the use of AI scribe software for clinical note transcription with the patient, who gave verbal consent to proceed.  History of Present Illness   Lisa Avila is a 68 year old female with COPD who presents for evaluation of her condition and ongoing tobacco use.  She has experienced increased fatigue and difficulty breathing over the past month, which have prevented her from attending pulmonary rehab this month, although she did attend last month. Her breathing feels 'off', and she has been experiencing headaches, particularly in the occipital region, which sometimes resolve when she lies down.  She reports visual disturbances, including seeing spots and floaters, which are bothersome and sometimes appear as if something is moving in her peripheral vision.  She continues to smoke despite attempts to quit using nicotine  patches, which she feels are ineffective. She has tried Chantix  in the past but discontinued it due to hallucinations and feeling unwell.  Hypnosis was also attempted without success.  She is currently using Trelegy for her COPD management. She mentions occasional coughing, and her cough is a dry cough. She has been trying to maintain some level of physical activity by using a pedal exerciser at home, although she finds it challenging to complete tasks like mowing the lawn due to her symptoms.     DATA 12/05/2019 PFT: FEV1 0.65 L or 28% predicted, FVC 1.58 L or 52% predicted, FEV1/FVC 41%. Postbronchodilator FEV1 with 20% no change and FVC with 14% no change indicating a degree of airway obstruction reversibility.  There is air trapping noted.  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 12/25/2019 2D echo: LVEF 60 to 65%.  Borderline LVH.  Abnormalities.  Mildly elevated pulmonary artery systolic pressure.  RV normal. 05/22/2020 low-dose chest CT: Multiple small lung nodules, benign in appearance, diffuse bronchial wall thickening and centrilobular and paraseptal emphysema 07/18/2021 low-dose chest CT: Multiple small lung nodules that are benign in appearance again noted.  Some reduced in size others stable.  Centrilobular emphysema again noted.  Follow-up in a year. 09/25/2021 PFTs: FEV1 0.71 L or 31% predicted, FVC 1.64 L or 55% predicted, FEV1/FVC 43%, lung volumes showed air trapping, no significant bronchodilator response.  Diffusion capacity severely reduced. 07/20/2022 LDCT: Lung RADS 2, moderate centrilobular emphysema, isolated right middle lobe pulmonary nodule 3 mm in size, mixed attenuation nodule on the prior exam has resolved. 07/13/2023 PFTs: FEV1 is 0.61 L or 27% predicted, FVC 1.60 L or 54% predicted, FEV1/FVC 38%,  lung volumes show air trapping, diffusion capacity is severely impaired.  Compared to prior study of 25 September 2021 there has been further decline in FEV1. 07/22/2023 LDCT chest: Lung RADS 3 probably benign findings.   Short-term follow-up in 6 months recommended.  Multiple pulmonary nodules without visible growth.  New left upper lobe nodule measuring 4.3 mm favor mucous impaction. 02/22/2024 LDCT chest: Scattered areas of new nodular consolidation in the lungs possibly infectious/inflammatory in etiology.  Malignancy cannot be excluded.  Recommend follow-up low-dose lung cancer screening CT in 4 to 6 weeks after appropriate clinical therapy as clinically indicated.  Emphysema.  Aortic atherosclerosis. 03/13/2024 PET/CT: Independent review, there is area of uptake on multiple lung nodules some of the lung nodules appears to have regressed in size.  Still suggestive of potential infectious/inflammatory process, formal interpretation by radiology confirms.  Review of Systems A 10 point review of systems was performed and it is as noted above otherwise negative.   Patient Active Problem List   Diagnosis Date Noted   Mixed hyperlipidemia 08/27/2023   Adenomatous polyp of colon 05/25/2023   Restless leg syndrome 05/10/2023   Major depression single episode, in partial remission 05/10/2023   Nocturnal hypoxemia due to emphysema (HCC) 05/10/2023   Stage 3 severe COPD by GOLD classification (HCC) 02/26/2014   Severe tobacco use disorder 02/26/2014    Social History   Tobacco Use   Smoking status: Every Day    Current packs/day: 1.00    Average packs/day: 2.0 packs/day for 52.8 years (104.2 ttl pk-yrs)    Types: Cigarettes    Start date: 1973   Smokeless tobacco: Never   Tobacco comments:          Substance Use Topics   Alcohol use: No    Alcohol/week: 0.0 standard drinks of alcohol    No Known Allergies  Current Meds  Medication Sig   albuterol  (PROVENTIL ) (2.5 MG/3ML) 0.083% nebulizer solution Take 3 mLs (2.5 mg total) by nebulization every 6 (six) hours as needed for wheezing or shortness of breath.   albuterol  (VENTOLIN  HFA) 108 (90 Base) MCG/ACT inhaler TAKE 2 PUFFS BY MOUTH EVERY 6 HOURS AS  NEEDED FOR WHEEZE OR SHORTNESS OF BREATH   azithromycin  (ZITHROMAX ) 250 MG tablet Take 2 tablets (500 mg) on  Day 1,  followed by 1 tablet (250 mg) once daily on Days 2 through 5.   cyclobenzaprine (FLEXERIL) 10 MG tablet Take 10 mg by mouth 3 (three) times daily as needed.   esomeprazole (NEXIUM) 20 MG capsule Take 20 mg by mouth daily at 12 noon.   methylPREDNISolone  (MEDROL  DOSEPAK) 4 MG TBPK tablet Take as directed in the package this is a taper pack.   OXYGEN  Inhale 2.5 L into the lungs at bedtime.   rOPINIRole (REQUIP) 0.5 MG tablet Take 0.5 tablets by mouth as needed.   sertraline  (ZOLOFT ) 50 MG tablet Take 1 tablet (50 mg total) by mouth daily.   Spacer/Aero-Holding Chambers (AEROCHAMBER MV) inhaler Use as instructed to aid with inhaled medications.   [DISCONTINUED] rosuvastatin  (CRESTOR ) 5 MG tablet Take 1 tablet (5 mg total) by mouth daily.   [DISCONTINUED] traMADol  (ULTRAM ) 50 MG tablet Take 1 tablet (50 mg total) by mouth at bedtime.   [DISCONTINUED] TRELEGY ELLIPTA  200-62.5-25 MCG/ACT AEPB Inhale 1 puff into the lungs daily.    Immunization History  Administered Date(s) Administered   Fluad Trivalent(High Dose 65+) 10/27/2023   INFLUENZA, HIGH DOSE SEASONAL PF 09/22/2024   Influenza Split 10/29/2013, 11/13/2014  Influenza,inj,Quad PF,6+ Mos 10/03/2018, 10/15/2020, 10/08/2022   Influenza-Unspecified 10/09/2019, 10/22/2021   PFIZER(Purple Top)SARS-COV-2 Vaccination 07/13/2020, 08/03/2020   PNEUMOCOCCAL CONJUGATE-20 08/26/2023   Pneumococcal Conjugate-13 11/13/2014   Tdap 08/11/2016        Objective:     BP 120/82   Pulse 84   Temp 97.6 F (36.4 C) (Temporal)   Ht 5' 2.5 (1.588 m)   Wt 137 lb (62.1 kg)   SpO2 96%   BMI 24.66 kg/m   SpO2: 96 %  GENERAL: Well-developed well-nourished woman, no acute distress.  She is fully ambulatory.  No conversational dyspnea.  Non toxic appearing. HEAD: Normocephalic, atraumatic. EYES: Pupils equal, round, reactive to light.   No scleral icterus. MOUTH: Dentures, upper or lower no thrush.  Oral mucosa moist. NECK: Supple. No thyromegaly. Trachea midline. No JVD.  No adenopathy. PULMONARY: Good air entry bilaterally.  Coarse breath sounds otherwise, scattered wheezing noted. CARDIOVASCULAR: S1 and S2. Regular rate and rhythm.  No rubs, murmurs or gallops heard. ABDOMEN: Benign. MUSCULOSKELETAL: Kyphosis noted, no clubbing, no edema. NEUROLOGIC: No focal deficit, no gait disturbance, speech is fluent. SKIN: Intact,warm,dry. PSYCH: Mood and behavior normal.     Assessment & Plan:     ICD-10-CM   1. Stage 3 severe COPD by GOLD classification (HCC)  J44.9     2. Acute bronchitis with COPD (HCC)  J44.0 DG Chest 2 View   J20.9     3. Asthma-COPD overlap syndrome (HCC)  J44.89 CBC with Differential/Platelet    Allergen Panel (27) + IGE    4. Bronchospasm  J98.01     5. Nocturnal hypoxemia due to emphysema (HCC)  J43.9    G47.36     6. Multiple lung nodules on CT  R91.8     7. Shortness of breath  R06.02 DG Chest 2 View      Orders Placed This Encounter  Procedures   DG Chest 2 View    Standing Status:   Future    Number of Occurrences:   1    Expected Date:   09/11/2024    Expiration Date:   09/11/2025    Reason for Exam (SYMPTOM  OR DIAGNOSIS REQUIRED):   Shortness of breath    Preferred imaging location?:   [facility]   CBC with Differential/Platelet    Standing Status:   Future    Number of Occurrences:   1    Expected Date:   09/11/2024    Expiration Date:   09/11/2025   Allergen Panel (27) + IGE    Standing Status:   Future    Number of Occurrences:   1    Expiration Date:   09/11/2025    Meds ordered this encounter  Medications   methylPREDNISolone  (MEDROL  DOSEPAK) 4 MG TBPK tablet    Sig: Take as directed in the package this is a taper pack.    Dispense:  21 tablet    Refill:  0   azithromycin  (ZITHROMAX ) 250 MG tablet    Sig: Take 2 tablets (500 mg) on  Day 1,  followed by 1  tablet (250 mg) once daily on Days 2 through 5.    Dispense:  6 each    Refill:  0   Assessment and Plan    Chronic obstructive pulmonary disease (COPD) exacerbation COPD exacerbation with increased wheezing and fatigue over the past month. She has not attended pulmonary rehab this month and reports headaches and sleep disturbances. Wheezing is present on examination. The exacerbation  is likely due to ongoing tobacco use and possible infection. - Order chest X-ray - Prescribe Medrol  taper pack - Prescribe antibiotic with anti-inflammatory properties - Order blood work for evaluation of potential allergen triggers  Headaches and visual disturbance Follow-up with primary physician and consider evaluation by ophthalmology.  Tobacco use disorder Ongoing tobacco use despite previous cessation attempts with nicotine  patches, Chantix , and hypnosis. She reports dissatisfaction with nicotine  patches and adverse effects with Chantix , including hallucinations. Strategies for smoking cessation were discussed, including reducing cigarette access by leaving them outside. - Encourage smoking cessation, possibly by reducing access to cigarettes      Advised if symptoms do not improve or worsen, to please contact office for sooner follow up or seek emergency care.    I spent 40 minutes of dedicated to the care of this patient on the date of this encounter to include pre-visit review of records, face-to-face time with the patient discussing conditions above, post visit ordering of testing, clinical documentation with the electronic health record, making appropriate referrals as documented, and communicating necessary findings to members of the patients care team.     C. Leita Sanders, MD Advanced Bronchoscopy PCC[facility]    *This note was generated using voice recognition software/Dragon and/or AI transcription program.  Despite best efforts to proofread, errors can occur which  can change the meaning. Any transcriptional errors that result from this process are unintentional and may not be fully corrected at the time of dictation.

## 2024-09-12 ENCOUNTER — Encounter: Payer: Self-pay | Admitting: Pulmonary Disease

## 2024-09-12 ENCOUNTER — Ambulatory Visit

## 2024-09-13 ENCOUNTER — Ambulatory Visit: Payer: Self-pay | Admitting: Pulmonary Disease

## 2024-09-13 LAB — ALLERGEN PANEL (27) + IGE
Alternaria Alternata IgE: <0.1 kU/L
Aspergillus Fumigatus IgE: <0.1 kU/L
Bahia Grass IgE: <0.1 kU/L
Bermuda Grass IgE: <0.1 kU/L
Cat Dander IgE: <0.1 kU/L
Cedar, Mountain IgE: <0.1 kU/L
Cladosporium Herbarum IgE: <0.1 kU/L
Cocklebur IgE: <0.1 kU/L
Cockroach, American IgE: <0.1 kU/L
Common Silver Birch IgE: <0.1 kU/L
D Farinae IgE: <0.1 kU/L
D Pteronyssinus IgE: <0.1 kU/L
Dog Dander IgE: <0.1 kU/L
Elm, American IgE: <0.1 kU/L
Hickory, White IgE: <0.1 kU/L
IgE (Immunoglobulin E), Serum: 22 [IU]/mL (ref 6–495)
Johnson Grass IgE: <0.1 kU/L
Kentucky Bluegrass IgE: <0.1 kU/L
Maple/Box Elder IgE: <0.1 kU/L
Mucor Racemosus IgE: <0.1 kU/L
Oak, White IgE: <0.1 kU/L
Penicillium Chrysogen IgE: <0.1 kU/L
Pigweed, Rough IgE: <0.1 kU/L
Plantain, English IgE: <0.1 kU/L
Ragweed, Short IgE: <0.1 kU/L
Setomelanomma Rostrat: <0.1 kU/L
Timothy Grass IgE: <0.1 kU/L
White Mulberry IgE: <0.1 kU/L

## 2024-09-14 ENCOUNTER — Encounter

## 2024-09-14 DIAGNOSIS — J4489 Other specified chronic obstructive pulmonary disease: Secondary | ICD-10-CM

## 2024-09-14 NOTE — Progress Notes (Signed)
 Pulmonary Individual Treatment Plan  Patient Details  Name: Lisa Avila MRN: [id number] Date of Birth: Jul 10, 1956 Referring Provider:   Flowsheet Row Pulmonary Rehab from 07/27/2024 in [facility] Cardiac and Pulmonary Rehab  Referring Provider Dr. Leita Adie    Initial Encounter Date:  Flowsheet Row Pulmonary Rehab from 07/27/2024 in [facility] Cardiac and Pulmonary Rehab  Date 07/27/24    Visit Diagnosis: COPD with asthma (HCC)  Patient's Home Medications on Admission:  Current Outpatient Medications:    albuterol  (PROVENTIL ) (2.5 MG/3ML) 0.083% nebulizer solution, Take 3 mLs (2.5 mg total) by nebulization every 6 (six) hours as needed for wheezing or shortness of breath., Disp: 75 mL, Rfl: 12   albuterol  (VENTOLIN  HFA) 108 (90 Base) MCG/ACT inhaler, TAKE 2 PUFFS BY MOUTH EVERY 6 HOURS AS NEEDED FOR WHEEZE OR SHORTNESS OF BREATH, Disp: 6.7 each, Rfl: 6   azithromycin  (ZITHROMAX ) 250 MG tablet, Take 2 tablets (500 mg) on  Day 1,  followed by 1 tablet (250 mg) once daily on Days 2 through 5., Disp: 6 each, Rfl: 0   cyclobenzaprine (FLEXERIL) 10 MG tablet, Take 10 mg by mouth 3 (three) times daily as needed., Disp: , Rfl:    esomeprazole (NEXIUM) 20 MG capsule, Take 20 mg by mouth daily at 12 noon., Disp: , Rfl:    methylPREDNISolone  (MEDROL  DOSEPAK) 4 MG TBPK tablet, Take as directed in the package this is a taper pack., Disp: 21 tablet, Rfl: 0   OXYGEN , Inhale 2.5 L into the lungs at bedtime., Disp: , Rfl:    rOPINIRole (REQUIP) 0.5 MG tablet, Take 0.5 tablets by mouth as needed., Disp: , Rfl:    rosuvastatin  (CRESTOR ) 5 MG tablet, TAKE 1 TABLET (5 MG TOTAL) BY MOUTH DAILY., Disp: 30 tablet, Rfl: 0   sertraline  (ZOLOFT ) 50 MG tablet, Take 1 tablet (50 mg total) by mouth daily., Disp: 90 tablet, Rfl: 1   Spacer/Aero-Holding Chambers (AEROCHAMBER MV) inhaler, Use as instructed to aid with inhaled medications., Disp: 1 each, Rfl: 0   traMADol  (ULTRAM ) 50 MG tablet, Take 1 tablet (50 mg total)  by mouth at bedtime., Disp: 90 tablet, Rfl: 1   TRELEGY ELLIPTA  200-62.5-25 MCG/ACT AEPB, Inhale 1 puff into the lungs daily., Disp: 60 each, Rfl: 10 No current facility-administered medications for this visit.  Facility-Administered Medications Ordered in Other Visits:    albuterol  (PROVENTIL ) (2.5 MG/3ML) 0.083% nebulizer solution 2.5 mg, 2.5 mg, Nebulization, Once, Lisa Dedra CROME, MD  Past Medical History: Past Medical History:  Diagnosis Date   Anxiety    Asthma    COPD (chronic obstructive pulmonary disease) (HCC)    Emphysema lung (HCC)    Fibroids    History of colonic polyps    Reflux    Restless leg syndrome     Tobacco Use: Social History   Tobacco Use  Smoking Status Every Day   Current packs/day: 1.00   Average packs/day: 2.0 packs/day for 52.7 years (104.1 ttl pk-yrs)   Types: Cigarettes   Start date: 1973  Smokeless Tobacco Never  Tobacco Comments          Labs: Review Flowsheet       Latest Ref Rng & Units 03/19/2020 08/26/2023 01/13/2024  Labs for [facility] Cardiac and Pulmonary Rehab  Cholestrol 100 - 199 mg/dL 785     765  822   LDL (calc) 0 - 99 mg/dL 857     844  898   HDL-C >39 mg/dL 50     60  59  Trlycerides 0 - 149 mg/dL 874     892  95     Details       This result is from an external source.          Pulmonary Assessment Scores:  Pulmonary Assessment Scores     Row Name 07/24/24 1510         ADL UCSD   ADL Phase Entry     SOB Score total 44     Rest 0     Walk 3     Stairs 1     Bath 2     Dress 2     Shop 0       CAT Score   CAT Score 22       mMRC Score   mMRC Score 3        UCSD: Self-administered rating of dyspnea associated with activities of daily living (ADLs) 6-point scale (0 = not at all to 5 = maximal or unable to do because of breathlessness)  Scoring Scores range from 0 to 120.  Minimally important difference is 5 units  CAT: CAT can identify the health impairment of COPD patients and is better  correlated with disease progression.  CAT has a scoring range of zero to 40. The CAT score is classified into four groups of low (less than 10), medium (10 - 20), high (21-30) and very high (31-40) based on the impact level of disease on health status. A CAT score over 10 suggests significant symptoms.  A worsening CAT score could be explained by an exacerbation, poor medication adherence, poor inhaler technique, or progression of COPD or comorbid conditions.  CAT MCID is 2 points  mMRC: mMRC (Modified Medical Research Council) Dyspnea Scale is used to assess the degree of baseline functional disability in patients of respiratory disease due to dyspnea. No minimal important difference is established. A decrease in score of 1 point or greater is considered a positive change.   Pulmonary Function Assessment:  Pulmonary Function Assessment - 07/12/24 1425       Breath   Shortness of Breath Yes;Limiting activity;Fear of Shortness of Breath          Exercise Target Goals: Exercise Program Goal: Individual exercise prescription set using results from initial 6 min walk test and THRR while considering  patient's activity barriers and safety.   Exercise Prescription Goal: Initial exercise prescription builds to 30-45 minutes a day of aerobic activity, 2-3 days per week.  Home exercise guidelines will be given to patient during program as part of exercise prescription that the participant will acknowledge.  Education: Aerobic Exercise: - Group verbal and visual presentation on the components of exercise prescription. Introduces F.I.T.T principle from ACSM for exercise prescriptions.  Reviews F.I.T.T. principles of aerobic exercise including progression. Written material provided at class time.   Education: Resistance Exercise: - Group verbal and visual presentation on the components of exercise prescription. Introduces F.I.T.T principle from ACSM for exercise prescriptions  Reviews F.I.T.T.  principles of resistance exercise including progression. Written material provided at class time. Flowsheet Row Pulmonary Rehab from 08/10/2024 in [facility] Cardiac and Pulmonary Rehab  Date 07/27/24  Educator MB  Instruction Review Code 1- [employer] Understanding     Education: Exercise & Equipment Safety: - Individual verbal instruction and demonstration of equipment use and safety with use of the equipment. Flowsheet Row Pulmonary Rehab from 08/10/2024 in [facility] Cardiac and Pulmonary Rehab  Date 07/24/24  Educator [facility]  Instruction Review Code  5- Refused Teaching    Education: Exercise Physiology & General Exercise Guidelines: - Group verbal and written instruction with models to review the exercise physiology of the cardiovascular system and associated critical values. Provides general exercise guidelines with specific guidelines to those with heart or lung disease.    Education: Flexibility, Balance, Mind/Body Relaxation: - Group verbal and visual presentation with interactive activity on the components of exercise prescription. Introduces F.I.T.T principle from ACSM for exercise prescriptions. Reviews F.I.T.T. principles of flexibility and balance exercise training including progression. Also discusses the mind body connection.  Reviews various relaxation techniques to help reduce and manage stress (i.e. Deep breathing, progressive muscle relaxation, and visualization). Balance handout provided to take home. Written material provided at class time. Flowsheet Row Pulmonary Rehab from 08/10/2024 in [facility] Cardiac and Pulmonary Rehab  Date 07/27/24  Educator MB  Instruction Review Code 1- Verbalizes Understanding    Activity Barriers & Risk Stratification:  Activity Barriers & Cardiac Risk Stratification - 07/27/24 1130       Activity Barriers & Cardiac Risk Stratification   Activity Barriers Back Problems;Deconditioning          6 Minute Walk:  6 Minute Walk     Row Name 07/27/24 1128          6 Minute Walk   Phase Initial     Distance 800 feet     Walk Time 4.22 minutes     # of Rest Breaks 3     MPH 2.15     METS 2.3     RPE 13     Perceived Dyspnea  3     VO2 Peak 8.05     Symptoms Yes (comment)     Comments SOB     Resting HR 86 bpm     Resting BP 136/72     Resting Oxygen  Saturation  91 %     Exercise Oxygen  Saturation  during 6 min walk 91 %     Max Ex. HR 92 bpm     Max Ex. BP 146/64     2 Minute Post BP 122/62       Interval HR   1 Minute HR 92     2 Minute HR 82     3 Minute HR 72     4 Minute HR 82     5 Minute HR 83     6 Minute HR 84     2 Minute Post HR 88     Interval Heart Rate? Yes       Interval Oxygen    Interval Oxygen ? Yes     Baseline Oxygen  Saturation % 91 %     1 Minute Oxygen  Saturation % 94 %     1 Minute Liters of Oxygen  0 L     2 Minute Oxygen  Saturation % 93 %     2 Minute Liters of Oxygen  0 L     3 Minute Oxygen  Saturation % 93 %     3 Minute Liters of Oxygen  0 L     4 Minute Oxygen  Saturation % 92 %     4 Minute Liters of Oxygen  0 L     5 Minute Oxygen  Saturation % 91 %     5 Minute Liters of Oxygen  0 L     6 Minute Oxygen  Saturation % 92 %     6 Minute Liters of Oxygen  0 L     2 Minute Post Oxygen  Saturation % 96 %  2 Minute Post Liters of Oxygen  0 L       Oxygen  Initial Assessment:  Oxygen  Initial Assessment - 07/12/24 1424       Home Oxygen    Home Oxygen  Device Portable Concentrator;Home Concentrator    Sleep Oxygen  Prescription Continuous    Liters per minute 2.5    Home Exercise Oxygen  Prescription None    Home Resting Oxygen  Prescription None    Compliance with Home Oxygen  Use Yes      Initial 6 min Walk   Oxygen  Used None      Program Oxygen  Prescription   Program Oxygen  Prescription None      Intervention   Short Term Goals To learn and exhibit compliance with exercise, home and travel O2 prescription;To learn and understand importance of maintaining oxygen  saturations>88%;To learn and  demonstrate proper use of respiratory medications;To learn and understand importance of monitoring SPO2 with pulse oximeter and demonstrate accurate use of the pulse oximeter.;To learn and demonstrate proper pursed lip breathing techniques or other breathing techniques.     Long  Term Goals Verbalizes importance of monitoring SPO2 with pulse oximeter and return demonstration;Exhibits proper breathing techniques, such as pursed lip breathing or other method taught during program session;Demonstrates proper use of MDI's;Compliance with respiratory medication;Maintenance of O2 saturations>88%;Exhibits compliance with exercise, home  and travel O2 prescription          Oxygen  Re-Evaluation:  Oxygen  Re-Evaluation     Row Name 07/27/24 1238             Goals/Expected Outcomes   Comments Reviewed PLB technique with pt.  Talked about how it works and it's importance in maintaining their exercise saturations.       Goals/Expected Outcomes Short: Become more profiecient at using PLB.   Long: Become independent at using PLB.          Oxygen  Discharge (Final Oxygen  Re-Evaluation):  Oxygen  Re-Evaluation - 07/27/24 1238       Goals/Expected Outcomes   Comments Reviewed PLB technique with pt.  Talked about how it works and it's importance in maintaining their exercise saturations.    Goals/Expected Outcomes Short: Become more profiecient at using PLB.   Long: Become independent at using PLB.          Initial Exercise Prescription:  Initial Exercise Prescription - 07/27/24 1100       Date of Initial Exercise RX and Referring Provider   Date 07/27/24    Referring Provider Dr. Leita Adie      Oxygen    Oxygen  Continuous    Liters 0L    Maintain Oxygen  Saturation 88% or higher      Recumbant Bike   Level 1    RPM 50    Watts 25    Minutes 15    METs 2.3      NuStep   Level 1    SPM 80    Minutes 15    METs 2.3      REL-XR   Level 1    Watts 25    Speed 50    Minutes 15     METs 2.3      T5 Nustep   Level 1    SPM 80    Minutes 15    METs 2.3      Biostep-RELP   Level 1    SPM 50    Minutes 15    METs 2.3      Track   Laps 20  Minutes 15    METs 2.3      Prescription Details   Duration Progress to 30 minutes of continuous aerobic without signs/symptoms of physical distress      Intensity   THRR 40-80% of Max Heartrate 113-140    Ratings of Perceived Exertion 11-13    Perceived Dyspnea 0-4      Progression   Progression Continue to progress workloads to maintain intensity without signs/symptoms of physical distress.      Resistance Training   Training Prescription Yes    Weight 2lb    Reps 10-15          Perform Capillary Blood Glucose checks as needed.  Exercise Prescription Changes:   Exercise Prescription Changes     Row Name 07/27/24 1100 08/18/24 1000           Response to Exercise   Blood Pressure (Admit) 136/72 124/70      Blood Pressure (Exercise) 146/64 128/66      Blood Pressure (Exit) 122/62 122/62      Heart Rate (Admit) 86 bpm 80 bpm      Heart Rate (Exercise) 92 bpm 97 bpm      Heart Rate (Exit) 88 bpm 82 bpm      Oxygen  Saturation (Admit) 91 % 94 %      Oxygen  Saturation (Exercise) 91 % 90 %      Oxygen  Saturation (Exit) 96 % 94 %      Rating of Perceived Exertion (Exercise) 13 13      Perceived Dyspnea (Exercise) 3 3      Symptoms SOB none      Comments results first 2 weeks of exercise      Duration -- Progress to 30 minutes of  aerobic without signs/symptoms of physical distress      Intensity -- THRR unchanged        Progression   Progression -- Continue to progress workloads to maintain intensity without signs/symptoms of physical distress.      Average METs -- 2.4        Resistance Training   Training Prescription -- Yes      Weight -- 3lb      Reps -- 10-15        Interval Training   Interval Training -- No        Oxygen    Oxygen  -- Continuous      Liters -- 0L         Recumbant Bike   Level -- 1      Watts -- 15      Minutes -- 15      METs -- 3.25        NuStep   Level -- 2      Minutes -- 15      METs -- 2.6        Track   Laps -- 16      Minutes -- 15      METs -- 1.87        Oxygen    Maintain Oxygen  Saturation -- 88% or higher         Exercise Comments:   Exercise Comments     Row Name 07/27/24 1237           Exercise Comments First full day of exercise!  Patient was oriented to gym and equipment including functions, settings, policies, and procedures.  Patient's individual exercise prescription and treatment plan were reviewed.  All starting workloads were  established based on the results of the 6 minute walk test done at initial orientation visit.  The plan for exercise progression was also introduced and progression will be customized based on patient's performance and goals.          Exercise Goals and Review:   Exercise Goals     Row Name 07/27/24 1136             Exercise Goals   Increase Physical Activity Yes       Intervention Provide advice, education, support and counseling about physical activity/exercise needs.;Develop an individualized exercise prescription for aerobic and resistive training based on initial evaluation findings, risk stratification, comorbidities and participant's personal goals.       Expected Outcomes Short Term: Attend rehab on a regular basis to increase amount of physical activity.;Long Term: Exercising regularly at least 3-5 days a week.;Long Term: Add in home exercise to make exercise part of routine and to increase amount of physical activity.       Increase Strength and Stamina Yes       Intervention Provide advice, education, support and counseling about physical activity/exercise needs.;Develop an individualized exercise prescription for aerobic and resistive training based on initial evaluation findings, risk stratification, comorbidities and participant's personal goals.        Expected Outcomes Short Term: Increase workloads from initial exercise prescription for resistance, speed, and METs.;Short Term: Perform resistance training exercises routinely during rehab and add in resistance training at home;Long Term: Improve cardiorespiratory fitness, muscular endurance and strength as measured by increased METs and functional capacity ( )       Able to understand and use rate of perceived exertion (RPE) scale Yes       Intervention Provide education and explanation on how to use RPE scale       Expected Outcomes Short Term: Able to use RPE daily in rehab to express subjective intensity level;Long Term:  Able to use RPE to guide intensity level when exercising independently       Able to understand and use Dyspnea scale Yes       Intervention Provide education and explanation on how to use Dyspnea scale       Expected Outcomes Short Term: Able to use Dyspnea scale daily in rehab to express subjective sense of shortness of breath during exertion;Long Term: Able to use Dyspnea scale to guide intensity level when exercising independently       Knowledge and understanding of Target Heart Rate Range (THRR) Yes       Intervention Provide education and explanation of THRR including how the numbers were predicted and where they are located for reference       Expected Outcomes Short Term: Able to state/look up THRR;Short Term: Able to use daily as guideline for intensity in rehab;Long Term: Able to use THRR to govern intensity when exercising independently       Able to check pulse independently Yes       Intervention Provide education and demonstration on how to check pulse in carotid and radial arteries.;Review the importance of being able to check your own pulse for safety during independent exercise       Expected Outcomes Short Term: Able to explain why pulse checking is important during independent exercise;Long Term: Able to check pulse independently and accurately        Understanding of Exercise Prescription Yes       Intervention Provide education, explanation, and written materials on patient's individual exercise prescription  Expected Outcomes Short Term: Able to explain program exercise prescription;Long Term: Able to explain home exercise prescription to exercise independently          Exercise Goals Re-Evaluation :  Exercise Goals Re-Evaluation     Row Name 07/27/24 1237 08/18/24 1023 08/30/24 1556         Exercise Goal Re-Evaluation   Exercise Goals Review Increase Physical Activity;Able to understand and use rate of perceived exertion (RPE) scale;Knowledge and understanding of Target Heart Rate Range (THRR);Understanding of Exercise Prescription;Increase Strength and Stamina;Able to check pulse independently;Able to understand and use Dyspnea scale Increase Physical Activity;Increase Strength and Stamina;Understanding of Exercise Prescription Increase Physical Activity;Increase Strength and Stamina;Understanding of Exercise Prescription     Comments Reviewed RPE and dyspnea scale, THR and program prescription with pt today.  Pt voiced understanding and was given a copy of goals to take home. Lisa Avila is off to a good start in the program, and was able to attend her first 3 sessions during this review period. During the sessions she was able to increase her handweights from 2lbs to 3lbs. She was also able to use the T4 nustep at level 2 and walk 16 laps on the track. We will continue to monitor her progress in the program. Lisa Avila has not attended rehab since the last review. We recently called her and left a message requesting that she call us  back to update us  on her status in the program. We will continue to monitor her progress when she returns to the program.     Expected Outcomes Short: Use RPE daily to regulate intensity.  Long: Follow program prescription in THR. Short: Continue to follow exercise prescription. Long: Continue exercise to improve  strength and stamina. Short: Return to rehab when appropriate. Long: Graduate.        Discharge Exercise Prescription (Final Exercise Prescription Changes):  Exercise Prescription Changes - 08/18/24 1000       Response to Exercise   Blood Pressure (Admit) 124/70    Blood Pressure (Exercise) 128/66    Blood Pressure (Exit) 122/62    Heart Rate (Admit) 80 bpm    Heart Rate (Exercise) 97 bpm    Heart Rate (Exit) 82 bpm    Oxygen  Saturation (Admit) 94 %    Oxygen  Saturation (Exercise) 90 %    Oxygen  Saturation (Exit) 94 %    Rating of Perceived Exertion (Exercise) 13    Perceived Dyspnea (Exercise) 3    Symptoms none    Comments first 2 weeks of exercise    Duration Progress to 30 minutes of  aerobic without signs/symptoms of physical distress    Intensity THRR unchanged      Progression   Progression Continue to progress workloads to maintain intensity without signs/symptoms of physical distress.    Average METs 2.4      Resistance Training   Training Prescription Yes    Weight 3lb    Reps 10-15      Interval Training   Interval Training No      Oxygen    Oxygen  Continuous    Liters 0L      Recumbant Bike   Level 1    Watts 15    Minutes 15    METs 3.25      NuStep   Level 2    Minutes 15    METs 2.6      Track   Laps 16    Minutes 15    METs 1.87  Oxygen    Maintain Oxygen  Saturation 88% or higher          Nutrition:  Target Goals: Understanding of nutrition guidelines, daily intake of sodium 1500mg , cholesterol 200mg , calories 30% from fat and 7% or less from saturated fats, daily to have 5 or more servings of fruits and vegetables.  Education: Nutrition 1 -Group instruction provided by verbal, written material, interactive activities, discussions, models, and posters to present general guidelines for heart healthy nutrition including macronutrients, label reading, and promoting whole foods over processed counterparts. Education serves as  Pensions consultant of discussion of heart healthy eating for all. Written material provided at class time. Flowsheet Row Pulmonary Rehab from 08/10/2024 in [facility] Cardiac and Pulmonary Rehab  Date 08/10/24  Educator JG  Instruction Review Code 1- [employer] Understanding      Education: Nutrition 2 -Group instruction provided by verbal, written material, interactive activities, discussions, models, and posters to present general guidelines for heart healthy nutrition including sodium, cholesterol, and saturated fat. Providing guidance of habit forming to improve blood pressure, cholesterol, and body weight. Written material provided at class time.     Biometrics:  Pre Biometrics - 07/27/24 1136       Pre Biometrics   Height 5' 2.3 (1.582 m)    Weight 138 lb 9.3 oz (62.9 kg)    Waist Circumference 36 inches    Hip Circumference 40 inches    Waist to Hip Ratio 0.9 %    BMI (Calculated) 25.12    Single Leg Stand 30 seconds           Nutrition Therapy Plan and Nutrition Goals:  Nutrition Therapy & Goals - 07/24/24 1506       Nutrition Therapy   RD appointment deferred Yes          Nutrition Assessments:  MEDIFICTS Score Key: >=70 Need to make dietary changes  40-70 Heart Healthy Diet <= 40 Therapeutic Level Cholesterol Diet  Flowsheet Row Pulmonary Rehab from 07/24/2024 in [facility] Cardiac and Pulmonary Rehab  Picture Your Plate Total Score on Admission 52   Picture Your Plate Scores: <59 Unhealthy dietary pattern with much room for improvement. 41-50 Dietary pattern unlikely to meet recommendations for good health and room for improvement. 51-60 More healthful dietary pattern, with some room for improvement.  >60 Healthy dietary pattern, although there may be some specific behaviors that could be improved.   Nutrition Goals Re-Evaluation:   Nutrition Goals Discharge (Final Nutrition Goals Re-Evaluation):   Psychosocial: Target Goals: Acknowledge presence or absence of  significant depression and/or stress, maximize coping skills, provide positive support system. Participant is able to verbalize types and ability to use techniques and skills needed for reducing stress and depression.   Education: Stress, Anxiety, and Depression - Group verbal and visual presentation to define topics covered.  Reviews how body is impacted by stress, anxiety, and depression.  Also discusses healthy ways to reduce stress and to treat/manage anxiety and depression.  Written material provided at class time.   Education: Sleep Hygiene -Provides group verbal and written instruction about how sleep can affect your health.  Define sleep hygiene, discuss sleep cycles and impact of sleep habits. Review good sleep hygiene tips.    Initial Review & Psychosocial Screening:  Initial Psych Review & Screening - 07/12/24 1427       Initial Review   Current issues with Current Psychotropic Meds;History of Depression      Family Dynamics   Good Support System? Yes    Comments  She can look to her mother, brother, daughter and son for support. She takes some medication for her history of depression.      Barriers   Psychosocial barriers to participate in program The patient should benefit from training in stress management and relaxation.      Screening Interventions   Interventions Encouraged to exercise;Provide feedback about the scores to participant;To provide support and resources with identified psychosocial needs    Expected Outcomes Short Term goal: Utilizing psychosocial counselor, staff and physician to assist with identification of specific Stressors or current issues interfering with healing process. Setting desired goal for each stressor or current issue identified.;Long Term Goal: Stressors or current issues are controlled or eliminated.;Short Term goal: Identification and review with participant of any Quality of Life or Depression concerns found by scoring the  questionnaire.;Long Term goal: The participant improves quality of Life and PHQ9 Scores as seen by post scores and/or verbalization of changes          Quality of Life Scores:  Scores of 19 and below usually indicate a poorer quality of life in these areas.  A difference of  2-3 points is a clinically meaningful difference.  A difference of 2-3 points in the total score of the Quality of Life Index has been associated with significant improvement in overall quality of life, self-image, physical symptoms, and general health in studies assessing change in quality of life.  PHQ-9: Review Flowsheet       07/24/2024 01/13/2024 08/26/2023 05/10/2023  Depression screen PHQ 2/9  Decreased Interest 0 0 0 0  Down, Depressed, Hopeless 0 0 0 0  PHQ - 2 Score 0 0 0 0  Altered sleeping 0 0 1 1  Tired, decreased energy 0 0 1 1  Change in appetite 0 0 0 1  Feeling bad or failure about yourself  0 0 0 0  Trouble concentrating 0 0 0 0  Moving slowly or fidgety/restless 0 0 0 0  Suicidal thoughts 0 0 0 0  PHQ-9 Score 0 0 2 3  Difficult doing work/chores - Not difficult at all Not difficult at all Not difficult at all   Interpretation of Total Score  Total Score Depression Severity:  1-4 = Minimal depression, 5-9 = Mild depression, 10-14 = Moderate depression, 15-19 = Moderately severe depression, 20-27 = Severe depression   Psychosocial Evaluation and Intervention:  Psychosocial Evaluation - 07/12/24 1429       Psychosocial Evaluation & Interventions   Interventions Encouraged to exercise with the program and follow exercise prescription;Relaxation education;Stress management education    Comments She can look to her mother, brother, daughter and son for support. She takes some medication for her history of depression.    Expected Outcomes Short: Start [facility] to help with mood. Long: Maintain a healthy mental state    Continue Psychosocial Services  Follow up required by staff           Psychosocial Re-Evaluation:   Psychosocial Discharge (Final Psychosocial Re-Evaluation):   Education: Education Goals: Education classes will be provided on a weekly basis, covering required topics. Participant will state understanding/return demonstration of topics presented.  Learning Barriers/Preferences:  Learning Barriers/Preferences - 07/12/24 1425       Learning Barriers/Preferences   Learning Barriers None    Learning Preferences None          General Pulmonary Education Topics:  Infection Prevention: - Provides verbal and written material to individual with discussion of infection control including proper hand washing  and proper equipment cleaning during exercise session. Flowsheet Row Pulmonary Rehab from 08/10/2024 in [facility] Cardiac and Pulmonary Rehab  Date 07/24/24  Educator [facility]  Instruction Review Code 1- Verbalizes Understanding    Falls Prevention: - Provides verbal and written material to individual with discussion of falls prevention and safety. Flowsheet Row Pulmonary Rehab from 08/10/2024 in [facility] Cardiac and Pulmonary Rehab  Date 07/24/24  Educator [facility]  Instruction Review Code 1- Verbalizes Understanding    Chronic Lung Disease Review: - Group verbal instruction with posters, models, PowerPoint presentations and videos,  to review new updates, new respiratory medications, new advancements in procedures and treatments. Providing information on websites and 800 numbers for continued self-education. Includes information about supplement oxygen , available portable oxygen  systems, continuous and intermittent flow rates, oxygen  safety, concentrators, and Medicare reimbursement for oxygen . Explanation of Pulmonary Drugs, including class, frequency, complications, importance of spacers, rinsing mouth after steroid MDI's, and proper cleaning methods for nebulizers. Review of basic lung anatomy and physiology related to function, structure, and complications of  lung disease. Review of risk factors. Discussion about methods for diagnosing sleep apnea and types of masks and machines for OSA. Includes a review of the use of types of environmental controls: home humidity, furnaces, filters, dust mite/pet prevention, HEPA vacuums. Discussion about weather changes, air quality and the benefits of nasal washing. Instruction on Warning signs, infection symptoms, calling MD promptly, preventive modes, and value of vaccinations. Review of effective airway clearance, coughing and/or vibration techniques. Emphasizing that all should Create an Action Plan. Written material provided at class time. Flowsheet Row Pulmonary Rehab from 08/10/2024 in [facility] Cardiac and Pulmonary Rehab  Education need identified 07/24/24    AED/CPR: - Group verbal and written instruction with the use of models to demonstrate the basic use of the AED with the basic ABC's of resuscitation.    Tests and Procedures:  - Group verbal and visual presentation and models provide information about basic cardiac anatomy and function. Reviews the testing methods done to diagnose heart disease and the outcomes of the test results. Describes the treatment choices: Medical Management, Angioplasty, or Coronary Bypass Surgery for treating various heart conditions including Myocardial Infarction, Angina, Valve Disease, and Cardiac Arrhythmias.  Written material provided at class time.   Medication Safety: - Group verbal and visual instruction to review commonly prescribed medications for heart and lung disease. Reviews the medication, class of the drug, and side effects. Includes the steps to properly store meds and maintain the prescription regimen.  Written material given at graduation.   Other: -Provides group and verbal instruction on various topics (see comments)   Knowledge Questionnaire Score:  Knowledge Questionnaire Score - 07/24/24 1508       Knowledge Questionnaire Score   Pre Score 13/18            Core Components/Risk Factors/Patient Goals at Admission:  Personal Goals and Risk Factors at Admission - 07/24/24 1506       Core Components/Risk Factors/Patient Goals on Admission    Weight Management Yes;Weight Loss    Intervention Weight Management: Develop a combined nutrition and exercise program designed to reach desired caloric intake, while maintaining appropriate intake of nutrient and fiber, sodium and fats, and appropriate energy expenditure required for the weight goal.;Weight Management: Provide education and appropriate resources to help participant work on and attain dietary goals.;Weight Management/Obesity: Establish reasonable short term and long term weight goals.    Admit Weight 139 lb 12.8 oz (63.4 kg)    Goal Weight:  Short Term 134 lb (60.8 kg)    Goal Weight: Long Term 134 lb (60.8 kg)    Expected Outcomes Short Term: Continue to assess and modify interventions until short term weight is achieved;Weight Loss: Understanding of general recommendations for a balanced deficit meal plan, which promotes 1-2 lb weight loss per week and includes a negative energy balance of [phone] kcal/d;Understanding recommendations for meals to include 15-35% energy as protein, 25-35% energy from fat, 35-60% energy from carbohydrates, less than 200mg  of dietary cholesterol, 20-35 gm of total fiber daily;Understanding of distribution of calorie intake throughout the day with the consumption of 4-5 meals/snacks    Tobacco Cessation Yes    Intervention Assist the participant in steps to quit. Provide individualized education and counseling about committing to Tobacco Cessation, relapse prevention, and pharmacological support that can be provided by physician.;Education officer, environmental, assist with locating and accessing local/national Quit Smoking programs, and support quit date choice.    Expected Outcomes Short Term: Will demonstrate readiness to quit, by selecting a quit date.;Long  Term: Complete abstinence from all tobacco products for at least 12 months from quit date.;Short Term: Will quit all tobacco product use, adhering to prevention of relapse plan.    Improve shortness of breath with ADL's Yes    Intervention Provide education, individualized exercise plan and daily activity instruction to help decrease symptoms of SOB with activities of daily living.    Expected Outcomes Short Term: Improve cardiorespiratory fitness to achieve a reduction of symptoms when performing ADLs;Long Term: Be able to perform more ADLs without symptoms or delay the onset of symptoms    Hypertension Yes    Intervention Provide education on lifestyle modifcations including regular physical activity/exercise, weight management, moderate sodium restriction and increased consumption of fresh fruit, vegetables, and low fat dairy, alcohol moderation, and smoking cessation.;Monitor prescription use compliance.    Expected Outcomes Short Term: Continued assessment and intervention until BP is < 140/40mm HG in hypertensive participants. < 130/70mm HG in hypertensive participants with diabetes, heart failure or chronic kidney disease.;Long Term: Maintenance of blood pressure at goal levels.          Education:Diabetes - Individual verbal and written instruction to review signs/symptoms of diabetes, desired ranges of glucose level fasting, after meals and with exercise. Acknowledge that pre and post exercise glucose checks will be done for 3 sessions at entry of program.   Know Your Numbers and Heart Failure: - Group verbal and visual instruction to discuss disease risk factors for cardiac and pulmonary disease and treatment options.  Reviews associated critical values for Overweight/Obesity, Hypertension, Cholesterol, and Diabetes.  Discusses basics of heart failure: signs/symptoms and treatments.  Introduces Heart Failure Zone chart for action plan for heart failure. Written material provided at class  time.   Core Components/Risk Factors/Patient Goals Review:    Core Components/Risk Factors/Patient Goals at Discharge (Final Review):    ITP Comments:  ITP Comments     Row Name 07/12/24 1432 07/27/24 1237 08/23/24 0818 09/14/24 1042     ITP Comments Virtual Visit completed. Patient informed on EP and RD appointment and 6 Minute walk test. Patient also informed of patient health questionnaires on My Chart. Patient Verbalizes understanding. Visit diagnosis can be found in [facility] 06/22/2024. First full day of exercise!  Patient was oriented to gym and equipment including functions, settings, policies, and procedures.  Patient's individual exercise prescription and treatment plan were reviewed.  All starting workloads were established based on the results of the 6 minute  walk test done at initial orientation visit.  The plan for exercise progression was also introduced and progression will be customized based on patient's performance and goals. 30 Day review completed. Medical Director ITP review done; changes made as directed and signed approval by Medical Director. New to program. Early discharge due to increased breathing problems.       Comments: Early Discharge

## 2024-09-14 NOTE — Progress Notes (Signed)
 Early Discharge Summary  Lisa Avila 03-18-1956  [name] is discharging early due to increased breathing problems. She completed 3 of 36 sessions.    6 Minute Walk     Row Name 07/27/24 1128         6 Minute Walk   Phase Initial     Distance 800 feet     Walk Time 4.22 minutes     # of Rest Breaks 3     MPH 2.15     METS 2.3     RPE 13     Perceived Dyspnea  3     VO2 Peak 8.05     Symptoms Yes (comment)     Comments SOB     Resting HR 86 bpm     Resting BP 136/72     Resting Oxygen  Saturation  91 %     Exercise Oxygen  Saturation  during 6 min walk 91 %     Max Ex. HR 92 bpm     Max Ex. BP 146/64     2 Minute Post BP 122/62       Interval HR   1 Minute HR 92     2 Minute HR 82     3 Minute HR 72     4 Minute HR 82     5 Minute HR 83     6 Minute HR 84     2 Minute Post HR 88     Interval Heart Rate? Yes       Interval Oxygen    Interval Oxygen ? Yes     Baseline Oxygen  Saturation % 91 %     1 Minute Oxygen  Saturation % 94 %     1 Minute Liters of Oxygen  0 L     2 Minute Oxygen  Saturation % 93 %     2 Minute Liters of Oxygen  0 L     3 Minute Oxygen  Saturation % 93 %     3 Minute Liters of Oxygen  0 L     4 Minute Oxygen  Saturation % 92 %     4 Minute Liters of Oxygen  0 L     5 Minute Oxygen  Saturation % 91 %     5 Minute Liters of Oxygen  0 L     6 Minute Oxygen  Saturation % 92 %     6 Minute Liters of Oxygen  0 L     2 Minute Post Oxygen  Saturation % 96 %     2 Minute Post Liters of Oxygen  0 L

## 2024-09-15 ENCOUNTER — Telehealth: Payer: Self-pay

## 2024-09-15 DIAGNOSIS — J439 Emphysema, unspecified: Secondary | ICD-10-CM

## 2024-09-15 NOTE — Telephone Encounter (Signed)
 Patient reports Lisa Avila is requesting new order for oxygen . She reports her machine broke.

## 2024-09-15 NOTE — Telephone Encounter (Signed)
 Patient advised that new DME order was placed. NFN.

## 2024-09-15 NOTE — Telephone Encounter (Signed)
 Pt returned your call and aware you are in room w/ pt and will call her back.

## 2024-09-15 NOTE — Telephone Encounter (Signed)
 Copied from CRM [id number]. Topic: Clinical - Order For Equipment >> Sep 14, 2024  2:40 PM Isabell A wrote: Reason for CRM: Patient is calling to get information on where she gets her oxygen  from - states she will need a new prescription.    Callback number: [phone]

## 2024-09-15 NOTE — Telephone Encounter (Signed)
 Okay to place order for oxygen  at 2 L/min NOCTURNALLY.  As of her most recent visit she has been very compliant with therapy.

## 2024-09-18 ENCOUNTER — Telehealth: Payer: Self-pay | Admitting: Pulmonary Disease

## 2024-09-18 DIAGNOSIS — J439 Emphysema, unspecified: Secondary | ICD-10-CM

## 2024-09-18 NOTE — Telephone Encounter (Signed)
 Okay to order overnight oximetry.

## 2024-09-18 NOTE — Telephone Encounter (Signed)
 ONO order has been sent to [facility] and confirmed by Tonya

## 2024-09-18 NOTE — Telephone Encounter (Signed)
 I received a message from Tonya with [employer]  Will need new sats to qualify patient for nocturnal 02. Will need an Overnight Oximetry Test to be done. Can you have Dr complete order and let know when this has been added to chart.

## 2024-09-19 ENCOUNTER — Ambulatory Visit

## 2024-09-19 NOTE — Telephone Encounter (Signed)
 Per [facility], new ONO ordered. NFN.

## 2024-09-21 ENCOUNTER — Encounter

## 2024-09-22 ENCOUNTER — Encounter: Payer: Self-pay | Admitting: Internal Medicine

## 2024-09-22 ENCOUNTER — Ambulatory Visit: Admitting: Internal Medicine

## 2024-09-22 ENCOUNTER — Ambulatory Visit

## 2024-09-22 VITALS — BP 122/70 | HR 88 | Ht 62.5 in | Wt 135.0 lb

## 2024-09-22 DIAGNOSIS — Z23 Encounter for immunization: Secondary | ICD-10-CM | POA: Diagnosis not present

## 2024-09-22 DIAGNOSIS — Z0000 Encounter for general adult medical examination without abnormal findings: Secondary | ICD-10-CM | POA: Diagnosis not present

## 2024-09-22 DIAGNOSIS — J449 Chronic obstructive pulmonary disease, unspecified: Secondary | ICD-10-CM

## 2024-09-22 DIAGNOSIS — Z1231 Encounter for screening mammogram for malignant neoplasm of breast: Secondary | ICD-10-CM

## 2024-09-22 DIAGNOSIS — G2581 Restless legs syndrome: Secondary | ICD-10-CM | POA: Diagnosis not present

## 2024-09-22 DIAGNOSIS — F324 Major depressive disorder, single episode, in partial remission: Secondary | ICD-10-CM

## 2024-09-22 MED ORDER — TRAMADOL HCL 50 MG PO TABS: 50.0000 mg | ORAL_TABLET | Freq: Every day | ORAL | 1 refills | Status: AC

## 2024-09-22 NOTE — Assessment & Plan Note (Signed)
 Clinically stable on Sertraline.   No SI or HI on evaluation. Plan to continue same medications for now.

## 2024-09-22 NOTE — Progress Notes (Signed)
 Date:  09/22/2024   Name:  Lisa Avila   DOB:  29-Jun-1956   MRN:  [id number]   Chief Complaint: Depression  Depression        This is a chronic problem.The problem is unchanged.  Associated symptoms include no fatigue, no myalgias and no headaches.  Past treatments include SSRIs - Selective serotonin reuptake inhibitors. RLS -  responds well to tramadol  50 mg at bedtime.  COPD - with asthma overlap.  Treated by Pulmonary with Trelegy and albuterol  prn. I offered testing for Alpha-1 def since I do not see this has been done.   Review of Systems  Constitutional:  Negative for fatigue and unexpected weight change.  HENT:  Negative for trouble swallowing.   Eyes:  Negative for visual disturbance.  Respiratory:  Negative for cough, chest tightness, shortness of breath and wheezing.   Cardiovascular:  Negative for chest pain, palpitations and leg swelling.  Gastrointestinal:  Negative for abdominal pain, constipation and diarrhea.  Musculoskeletal:  Negative for arthralgias and myalgias.  Neurological:  Negative for dizziness, weakness, light-headedness and headaches.  Psychiatric/Behavioral:  Positive for depression.      Lab Results  Component Value Date   NA 142 01/13/2024   K 4.1 01/13/2024   CO2 25 01/13/2024   GLUCOSE 126 (H) 01/13/2024   BUN 10 01/13/2024   CREATININE 0.83 01/13/2024   CALCIUM  9.5 01/13/2024   EGFR 78 01/13/2024   Lab Results  Component Value Date   CHOL 177 01/13/2024   HDL 59 01/13/2024   LDLCALC 101 (H) 01/13/2024   TRIG 95 01/13/2024   CHOLHDL 3.0 01/13/2024   Lab Results  Component Value Date   TSH 2.190 08/26/2023   No results found for: HGBA1C Lab Results  Component Value Date   WBC 5.4 09/11/2024   HGB 15.0 09/11/2024   HCT 45.6 09/11/2024   MCV 94.2 09/11/2024   PLT 227 09/11/2024   Lab Results  Component Value Date   ALT 15 01/13/2024   AST 19 01/13/2024   ALKPHOS 96 01/13/2024   BILITOT 0.2 01/13/2024   No results  found for: MARIEN BOLLS, VD25OH   Patient Active Problem List   Diagnosis Date Noted   Mixed hyperlipidemia 08/27/2023   Adenomatous polyp of colon 05/25/2023   Restless leg syndrome 05/10/2023   Major depression single episode, in partial remission 05/10/2023   Nocturnal hypoxemia due to emphysema (HCC) 05/10/2023   Stage 3 severe COPD by GOLD classification (HCC) 02/26/2014   Severe tobacco use disorder 02/26/2014    No Known Allergies  Past Surgical History:  Procedure Laterality Date   CHOLECYSTECTOMY  2010   COLONOSCOPY WITH PROPOFOL  N/A 05/25/2023   Procedure: COLONOSCOPY WITH BIOPSY;  Surgeon: Unk Corinn Skiff, MD;  Location: [facility];  Service: Endoscopy;  Laterality: N/A;   POLYPECTOMY  05/25/2023   Procedure: POLYPECTOMY;  Surgeon: Unk Corinn Skiff, MD;  Location: [facility];  Service: Endoscopy;;  CLIP X 1 PLACED AT ASCENDING COLON POLYP REMOVAL SITE   TUBAL LIGATION  1983    Social History   Tobacco Use   Smoking status: Every Day    Current packs/day: 1.00    Average packs/day: 2.0 packs/day for 52.7 years (104.2 ttl pk-yrs)    Types: Cigarettes    Start date: 1973   Smokeless tobacco: Never   Tobacco comments:          Vaping Use   Vaping status: Never Used  Substance Use Topics  Alcohol use: No    Alcohol/week: 0.0 standard drinks of alcohol   Drug use: No     Medication list has been reviewed and updated.  Current Meds  Medication Sig   albuterol  (PROVENTIL ) (2.5 MG/3ML) 0.083% nebulizer solution Take 3 mLs (2.5 mg total) by nebulization every 6 (six) hours as needed for wheezing or shortness of breath.   albuterol  (VENTOLIN  HFA) 108 (90 Base) MCG/ACT inhaler TAKE 2 PUFFS BY MOUTH EVERY 6 HOURS AS NEEDED FOR WHEEZE OR SHORTNESS OF BREATH   azithromycin  (ZITHROMAX ) 250 MG tablet Take 2 tablets (500 mg) on  Day 1,  followed by 1 tablet (250 mg) once daily on Days 2 through 5.   cyclobenzaprine (FLEXERIL) 10 MG  tablet Take 10 mg by mouth 3 (three) times daily as needed.   esomeprazole (NEXIUM) 20 MG capsule Take 20 mg by mouth daily at 12 noon.   methylPREDNISolone  (MEDROL  DOSEPAK) 4 MG TBPK tablet Take as directed in the package this is a taper pack.   OXYGEN  Inhale 2.5 L into the lungs at bedtime.   rOPINIRole (REQUIP) 0.5 MG tablet Take 0.5 tablets by mouth as needed.   rosuvastatin  (CRESTOR ) 5 MG tablet TAKE 1 TABLET (5 MG TOTAL) BY MOUTH DAILY.   sertraline  (ZOLOFT ) 50 MG tablet Take 1 tablet (50 mg total) by mouth daily.   Spacer/Aero-Holding Chambers (AEROCHAMBER MV) inhaler Use as instructed to aid with inhaled medications.   TRELEGY ELLIPTA  200-62.5-25 MCG/ACT AEPB Inhale 1 puff into the lungs daily.   [DISCONTINUED] traMADol  (ULTRAM ) 50 MG tablet Take 1 tablet (50 mg total) by mouth at bedtime.       09/22/2024    2:06 PM 01/13/2024    9:38 AM 08/26/2023   11:03 AM 05/10/2023    2:23 PM  GAD 7 : Generalized Anxiety Score  Nervous, Anxious, on Edge 0 0 0 1  Control/stop worrying 0 0 0 1  Worry too much - different things 0 0 0 1  Trouble relaxing 0 0 0 0  Restless 0 0 0 0  Easily annoyed or irritable 0 0 0 0  Afraid - awful might happen 0 0 0 0  Total GAD 7 Score 0 0 0 3  Anxiety Difficulty Not difficult at all Not difficult at all Not difficult at all Not difficult at all       09/22/2024    2:06 PM 07/24/2024    3:11 PM 01/13/2024    9:37 AM  Depression screen PHQ 2/9  Decreased Interest 0 0 0  Down, Depressed, Hopeless 0 0 0  PHQ - 2 Score 0 0 0  Altered sleeping 0 0 0  Tired, decreased energy 0 0 0  Change in appetite 0 0 0  Feeling bad or failure about yourself  0 0 0  Trouble concentrating 0 0 0  Moving slowly or fidgety/restless 0 0 0  Suicidal thoughts 0 0 0  PHQ-9 Score 0 0 0  Difficult doing work/chores Not difficult at all  Not difficult at all    BP Readings from Last 3 Encounters:  09/22/24 122/70  09/22/24 122/70  09/11/24 120/82    Physical  Exam Vitals and nursing note reviewed.  Constitutional:      General: She is not in acute distress.    Appearance: Normal appearance. She is well-developed.  HENT:     Head: Normocephalic and atraumatic.  Cardiovascular:     Rate and Rhythm: Normal rate and regular rhythm.  Pulmonary:  Effort: Pulmonary effort is normal. No respiratory distress.     Breath sounds: Decreased breath sounds present. No wheezing or rhonchi.  Musculoskeletal:     Cervical back: Normal range of motion.  Lymphadenopathy:     Cervical: No cervical adenopathy.  Skin:    General: Skin is warm and dry.     Findings: No rash.  Neurological:     Mental Status: She is alert and oriented to person, place, and time.  Psychiatric:        Attention and Perception: Attention and perception normal.        Mood and Affect: Mood and affect normal.        Behavior: Behavior normal.     Wt Readings from Last 3 Encounters:  09/22/24 135 lb (61.2 kg)  09/22/24 135 lb (61.2 kg)  09/11/24 137 lb (62.1 kg)    BP 122/70   Pulse 88   Ht 5' 2.5 (1.588 m)   Wt 135 lb (61.2 kg)   SpO2 94%   BMI 24.30 kg/m   Assessment and Plan:  Problem List Items Addressed This Visit       Unprioritized   Major depression single episode, in partial remission - Primary (Chronic)   Clinically stable on Sertraline .   No SI or HI on evaluation. Plan to continue same medications for now.       Restless leg syndrome (Chronic)   Currently treated with Tramadol  and Ropinirole as needed.      Stage 3 severe COPD by GOLD classification (HCC) (Chronic)   Followed by Pulmonary - can not find any Alpha-1 def testing in the chart. Will offer to patient. Also taking Tramadol  as needed for cough as well.      Relevant Medications   traMADol  (ULTRAM ) 50 MG tablet   Other Visit Diagnoses       Encounter for screening mammogram for breast cancer         Encounter for immunization       Relevant Orders   Flu vaccine HIGH DOSE  PF(Fluzone Trivalent) (Completed)      MAW done today by CMA, in person.  Return in about 4 months (around 01/22/2025) for [facility] CPX  Dr. Lemon.    Leita HILARIO Adie, MD [facility] [place]

## 2024-09-22 NOTE — Patient Instructions (Signed)
 Call [facility] to schedule your mammogram at [phone].

## 2024-09-22 NOTE — Progress Notes (Signed)
 Subjective:   Lisa Avila is a 68 y.o. female who presents for an Initial Medicare Annual Wellness Visit.  Visit Complete: In person  Patient Medicare AWV questionnaire was completed by the patient on 09/22/2024; I have confirmed that all information answered by patient is correct and no changes since this date.  Cardiac Risk Factors include: advanced age (>79men, >65 women);sedentary lifestyle;smoking/ tobacco exposure     Objective:    Today's Vitals   09/22/24 1419 09/22/24 1420  BP: 122/70   Pulse: 88   SpO2: 94%   Weight: 135 lb (61.2 kg)   Height: 5' 2.5 (1.588 m)   PainSc: 0-No pain 0-No pain   Body mass index is 24.3 kg/m.     09/22/2024    2:22 PM 07/12/2024    2:20 PM 08/08/2018   11:57 AM 08/11/2016   12:54 AM  Advanced Directives  Does Patient Have a Medical Advance Directive? No No No  No   Would patient like information on creating a medical advance directive? No - Patient declined No - Patient declined  No - patient declined information      Data saved with a previous flowsheet row definition     Current Medications (verified) Outpatient Encounter Medications as of 09/22/2024  Medication Sig   albuterol  (PROVENTIL ) (2.5 MG/3ML) 0.083% nebulizer solution Take 3 mLs (2.5 mg total) by nebulization every 6 (six) hours as needed for wheezing or shortness of breath.   albuterol  (VENTOLIN  HFA) 108 (90 Base) MCG/ACT inhaler TAKE 2 PUFFS BY MOUTH EVERY 6 HOURS AS NEEDED FOR WHEEZE OR SHORTNESS OF BREATH   azithromycin  (ZITHROMAX ) 250 MG tablet Take 2 tablets (500 mg) on  Day 1,  followed by 1 tablet (250 mg) once daily on Days 2 through 5.   cyclobenzaprine (FLEXERIL) 10 MG tablet Take 10 mg by mouth 3 (three) times daily as needed.   esomeprazole (NEXIUM) 20 MG capsule Take 20 mg by mouth daily at 12 noon.   methylPREDNISolone  (MEDROL  DOSEPAK) 4 MG TBPK tablet Take as directed in the package this is a taper pack.   OXYGEN  Inhale 2.5 L into the lungs at  bedtime.   rOPINIRole (REQUIP) 0.5 MG tablet Take 0.5 tablets by mouth as needed.   rosuvastatin  (CRESTOR ) 5 MG tablet TAKE 1 TABLET (5 MG TOTAL) BY MOUTH DAILY.   sertraline  (ZOLOFT ) 50 MG tablet Take 1 tablet (50 mg total) by mouth daily.   Spacer/Aero-Holding Chambers (AEROCHAMBER MV) inhaler Use as instructed to aid with inhaled medications.   traMADol  (ULTRAM ) 50 MG tablet Take 1 tablet (50 mg total) by mouth at bedtime.   TRELEGY ELLIPTA  200-62.5-25 MCG/ACT AEPB Inhale 1 puff into the lungs daily.   Facility-Administered Encounter Medications as of 09/22/2024  Medication   albuterol  (PROVENTIL ) (2.5 MG/3ML) 0.083% nebulizer solution 2.5 mg    Allergies (verified) Patient has no known allergies.   History: Past Medical History:  Diagnosis Date   Anxiety    Asthma    COPD (chronic obstructive pulmonary disease) (HCC)    Emphysema lung (HCC)    Emphysema of lung (HCC)    Copd  not sure of date of diagnosis   Fibroids    GERD (gastroesophageal reflux disease)    Long time take esomepraole every night before  bed   History of colonic polyps    Neuromuscular disorder (HCC) 2020 sciatica   Not as bad but flares up occasionally   Oxygen  deficiency 2022   At night only  Reflux    Restless leg syndrome    Past Surgical History:  Procedure Laterality Date   CHOLECYSTECTOMY  2010   COLONOSCOPY WITH PROPOFOL  N/A 05/25/2023   Procedure: COLONOSCOPY WITH BIOPSY;  Surgeon: Unk Corinn Skiff, MD;  Location: [facility];  Service: Endoscopy;  Laterality: N/A;   POLYPECTOMY  05/25/2023   Procedure: POLYPECTOMY;  Surgeon: Unk Corinn Skiff, MD;  Location: [facility];  Service: Endoscopy;;  CLIP X 1 PLACED AT ASCENDING COLON POLYP REMOVAL SITE   TUBAL LIGATION  1983   Family History  Problem Relation Age of Onset   Emphysema Maternal Grandmother    Lung cancer Paternal Grandfather    Asthma Paternal Grandmother    Hypertension Mother    Hypertension Father     Social History   Socioeconomic History   Marital status: Single    Spouse name: Not on file   Number of children: Not on file   Years of education: Not on file   Highest education level: GED or equivalent  Occupational History   Not on file  Tobacco Use   Smoking status: Every Day    Current packs/day: 1.00    Average packs/day: 2.0 packs/day for 52.7 years (104.2 ttl pk-yrs)    Types: Cigarettes    Start date: 1973   Smokeless tobacco: Never   Tobacco comments:          Vaping Use   Vaping status: Never Used  Substance and Sexual Activity   Alcohol use: No    Alcohol/week: 0.0 standard drinks of alcohol   Drug use: No   Sexual activity: Not Currently  Other Topics Concern   Not on file  Social History Narrative   Not on file   Social Drivers of Health   Financial Resource Strain: Low Risk  (09/22/2024)   Overall Financial Resource Strain (CARDIA)    Difficulty of Paying Living Expenses: Not very hard  Food Insecurity: No Food Insecurity (09/22/2024)   Hunger Vital Sign    Worried About Running Out of Food in the Last Year: Never true    Ran Out of Food in the Last Year: Never true  Transportation Needs: No Transportation Needs (09/22/2024)   PRAPARE - Administrator, Civil Service (Medical): No    Lack of Transportation (Non-Medical): No  Physical Activity: Sufficiently Active (09/22/2024)   Exercise Vital Sign    Days of Exercise per Week: 3 days    Minutes of Exercise per Session: 60 min  Stress: No Stress Concern Present (09/22/2024)   [employer] of Occupational Health - Occupational Stress Questionnaire    Feeling of Stress: Not at all  Social Connections: Socially Isolated (09/22/2024)   Social Connection and Isolation Panel    Frequency of Communication with Friends and Family: Three times a week    Frequency of Social Gatherings with Friends and Family: Twice a week    Attends Religious Services: Never    Database administrator or  Organizations: No    Attends Banker Meetings: Never    Marital Status: Widowed    Tobacco Counseling Ready to quit: Not Answered Counseling given: Not Answered Tobacco comments:     Clinical Intake:  Pre-visit preparation completed: Yes  Pain : No/denies pain Pain Score: 0-No pain     BMI - recorded: 24.3 Nutritional Status: BMI of 19-24  Normal Nutritional Risks: None Diabetes: No  How often do you need to have someone help you when you  read instructions, pamphlets, or other written materials from your doctor or pharmacy?: 1 - Never  Interpreter Needed?: No  Information entered by :: Worthington Cruzan, CMA   Activities of Daily Living    09/22/2024    2:23 PM  In your present state of health, do you have any difficulty performing the following activities:  Hearing? 0  Vision? 0  Difficulty concentrating or making decisions? 0  Walking or climbing stairs? 1  Dressing or bathing? 1  Doing errands, shopping? 0  Preparing Food and eating ? N  Using the Toilet? N  In the past six months, have you accidently leaked urine? N  Do you have problems with loss of bowel control? N  Managing your Medications? N  Managing your Finances? N  Housekeeping or managing your Housekeeping? Y    Patient Care Team: Justus Leita DEL, MD as PCP - General (Internal Medicine) Tamea Dedra CROME, MD as Consulting Physician (Pulmonary Disease)  Indicate any recent Medical Services you may have received from other than [facility] providers in the past year (date may be approximate).     Assessment:   This is a routine wellness examination for [place].  Hearing/Vision screen Hearing Screening - Comments:: No concerns at this time. Vision Screening - Comments:: Patient wears glasses and see's her eye doctor regularly.   Goals Addressed   None    Depression Screen    09/22/2024    2:06 PM 07/24/2024    3:11 PM 01/13/2024    9:37 AM 08/26/2023   11:03 AM 05/10/2023     2:23 PM  PHQ 2/9 Scores  PHQ - 2 Score 0 0 0 0 0  PHQ- 9 Score 0 0 0 2 3    Fall Risk    09/22/2024    2:06 PM 07/12/2024    2:20 PM 01/13/2024    9:37 AM 08/26/2023   11:03 AM 05/10/2023    2:23 PM  Fall Risk   Falls in the past year? 0 0 0 0 0  Number falls in past yr: 0 0 0 0 0  Injury with Fall? 0 0 0 0 0  Risk for fall due to : No Fall Risks No Fall Risks No Fall Risks No Fall Risks No Fall Risks  Follow up Falls evaluation completed Falls evaluation completed;Education provided;Falls prevention discussed Falls evaluation completed Falls evaluation completed Falls evaluation completed    MEDICARE RISK AT HOME: Medicare Risk at Home Any stairs in or around the home?: No If so, are there any without handrails?: No Home free of loose throw rugs in walkways, pet beds, electrical cords, etc?: Yes Adequate lighting in your home to reduce risk of falls?: Yes Life alert?: No Use of a cane, walker or w/c?: Yes Grab bars in the bathroom?: No Shower chair or bench in shower?: No Elevated toilet seat or a handicapped toilet?: No  TIMED UP AND GO:  Was the test performed? Yes  Length of time to ambulate 10 feet: 8 sec Gait slow and steady without use of assistive device    Cognitive Function:    09/22/2024    2:24 PM  MMSE - Mini Mental State Exam  Not completed: Unable to complete        09/22/2024    2:23 PM  6CIT Screen  What Year? 0 points  What month? 0 points  What time? 0 points  Count back from 20 0 points  Months in reverse 0 points  Repeat phrase 2 points  Total Score 2 points    Immunizations Immunization History  Administered Date(s) Administered   Fluad Trivalent(High Dose 65+) 10/27/2023   Influenza Split 10/29/2013, 11/13/2014   Influenza,inj,Quad PF,6+ Mos 10/03/2018, 10/15/2020, 10/08/2022   Influenza-Unspecified 10/09/2019, 10/22/2021   PFIZER(Purple Top)SARS-COV-2 Vaccination 07/13/2020, 08/03/2020   PNEUMOCOCCAL CONJUGATE-20 08/26/2023    Pneumococcal Conjugate-13 11/13/2014   Tdap 08/11/2016    TDAP status: Up to date  Flu Vaccine status: Completed at today's visit  Pneumococcal vaccine status: Up to date  Covid-19 vaccine status: Completed vaccines  Qualifies for Shingles Vaccine? Yes   Zostavax completed No   Shingrix Completed?: No.    Education has been provided regarding the importance of this vaccine. Patient has been advised to call insurance company to determine out of pocket expense if they have not yet received this vaccine. Advised may also receive vaccine at local pharmacy or Health Dept. Verbalized acceptance and understanding.  Screening Tests Health Maintenance  Topic Date Due   Zoster Vaccines- Shingrix (1 of 2) Never done   DEXA SCAN  Never done   Mammogram  07/06/2024   Influenza Vaccine  07/28/2024   COVID-19 Vaccine (3 - 2025-26 season) 08/28/2024   Lung Cancer Screening  06/15/2025   Medicare Annual Wellness (AWV)  09/22/2025   DTaP/Tdap/Td (2 - Td or Tdap) 08/11/2026   Colonoscopy  05/24/2028   Pneumococcal Vaccine: 50+ Years  Completed   Hepatitis C Screening  Completed   HPV VACCINES  Aged Out   Meningococcal B Vaccine  Aged Out    Health Maintenance  Health Maintenance Due  Topic Date Due   Zoster Vaccines- Shingrix (1 of 2) Never done   DEXA SCAN  Never done   Mammogram  07/06/2024   Influenza Vaccine  07/28/2024   COVID-19 Vaccine (3 - 2025-26 season) 08/28/2024    Colorectal cancer screening: Type of screening: Colonoscopy. Completed 05/25/2023. Repeat every 5 years  Mammogram status: Completed 07/07/2023. Repeat every year   Lung Cancer Screening: (Low Dose CT Chest recommended if Age 69-80 years, 20 pack-year currently smoking OR have quit w/in 15years.) does qualify.   Lung Cancer Screening Referral: Completed in June 2025  Additional Screening:  Hepatitis C Screening: does qualify; Completed 08/26/2023  Vision Screening: Recommended annual ophthalmology exams  for early detection of glaucoma and other disorders of the eye. Is the patient up to date with their annual eye exam?  Yes  Who is the provider or what is the name of the office in which the patient attends annual eye exams? [facility] [place] [place]   Dental Screening: Recommended annual dental exams for proper oral hygiene   Community Resource Referral / Chronic Care Management: CRR required this visit?  No   CCM required this visit?  No     Plan:     I have personally reviewed and noted the following in the patient's chart:   Medical and social history Use of alcohol, tobacco or illicit drugs  Current medications and supplements including opioid prescriptions. Patient is not currently taking opioid prescriptions. Functional ability and status Nutritional status Physical activity Advanced directives List of other physicians Hospitalizations, surgeries, and ER visits in previous 12 months Vitals Screenings to include cognitive, depression, and falls Referrals and appointments  In addition, I have reviewed and discussed with patient certain preventive protocols, quality metrics, and best practice recommendations. A written personalized care plan for preventive services as well as general preventive health recommendations were provided to patient.  Destin Kittler N Author [place], CMA   09/22/2024   After Visit Summary: (In Person-Declined) Patient declined AVS at this time.  Nurse Notes: Due for Mammogram, Will place order. Patient informed by Pcp.

## 2024-09-22 NOTE — Assessment & Plan Note (Addendum)
 Currently treated with Tramadol  and Ropinirole as needed.

## 2024-09-22 NOTE — Assessment & Plan Note (Addendum)
 Followed by Pulmonary - can not find any Alpha-1 def testing in the chart. Will offer to patient. Also taking Tramadol  as needed for cough as well.

## 2024-09-22 NOTE — Patient Instructions (Signed)
 Lisa Avila , Thank you for taking time to come for your Medicare Wellness Visit. I appreciate your ongoing commitment to your health goals. Please review the following plan we discussed and let me know if I can assist you in the future.   These are the goals we discussed:  Goals   None     This is a list of the screening recommended for you and due dates:  Health Maintenance  Topic Date Due   Zoster (Shingles) Vaccine (1 of 2) Never done   DEXA scan (bone density measurement)  Never done   Breast Cancer Screening  07/06/2024   Flu Shot  07/28/2024   COVID-19 Vaccine (3 - 2025-26 season) 08/28/2024   Screening for Lung Cancer  06/15/2025   Medicare Annual Wellness Visit  09/22/2025   DTaP/Tdap/Td vaccine (2 - Td or Tdap) 08/11/2026   Colon Cancer Screening  05/24/2028   Pneumococcal Vaccine for age over 43  Completed   Hepatitis C Screening  Completed   HPV Vaccine  Aged Out   Meningitis B Vaccine  Aged Out   Health Maintenance After Age 15 After age 54, you are at a higher risk for certain long-term diseases and infections as well as injuries from falls. Falls are a major cause of broken bones and head injuries in people who are older than age 53. Getting regular preventive care can help to keep you healthy and well. Preventive care includes getting regular testing and making lifestyle changes as recommended by your health care provider. Talk with your health care provider about: Which screenings and tests you should have. A screening is a test that checks for a disease when you have no symptoms. A diet and exercise plan that is right for you. What should I know about screenings and tests to prevent falls? Screening and testing are the best ways to find a health problem early. Early diagnosis and treatment give you the best chance of managing medical conditions that are common after age 34. Certain conditions and lifestyle choices may make you more likely to have a fall. Your health  care provider may recommend: Regular vision checks. Poor vision and conditions such as cataracts can make you more likely to have a fall. If you wear glasses, make sure to get your prescription updated if your vision changes. Medicine review. Work with your health care provider to regularly review all of the medicines you are taking, including over-the-counter medicines. Ask your health care provider about any side effects that may make you more likely to have a fall. Tell your health care provider if any medicines that you take make you feel dizzy or sleepy. Strength and balance checks. Your health care provider may recommend certain tests to check your strength and balance while standing, walking, or changing positions. Foot health exam. Foot pain and numbness, as well as not wearing proper footwear, can make you more likely to have a fall. Screenings, including: Osteoporosis screening. Osteoporosis is a condition that causes the bones to get weaker and break more easily. Blood pressure screening. Blood pressure changes and medicines to control blood pressure can make you feel dizzy. Depression screening. You may be more likely to have a fall if you have a fear of falling, feel depressed, or feel unable to do activities that you used to do. Alcohol use screening. Using too much alcohol can affect your balance and may make you more likely to have a fall. Follow these instructions at home: Lifestyle Do not  drink alcohol if: Your health care provider tells you not to drink. If you drink alcohol: Limit how much you have to: 0-1 drink a day for women. 0-2 drinks a day for men. Know how much alcohol is in your drink. In the [place] one drink equals one 12 oz bottle of beer (355 mL), one 5 oz glass of wine (148 mL), or one 1 oz glass of hard liquor (44 mL). Do not use any products that contain nicotine  or tobacco. These products include cigarettes, chewing tobacco, and vaping devices, such as  e-cigarettes. If you need help quitting, ask your health care provider. Activity  Follow a regular exercise program to stay fit. This will help you maintain your balance. Ask your health care provider what types of exercise are appropriate for you. If you need a cane or walker, use it as recommended by your health care provider. Wear supportive shoes that have nonskid soles. Safety  Remove any tripping hazards, such as rugs, cords, and clutter. Install safety equipment such as grab bars in bathrooms and safety rails on stairs. Keep rooms and walkways well-lit. General instructions Talk with your health care provider about your risks for falling. Tell your health care provider if: You fall. Be sure to tell your health care provider about all falls, even ones that seem minor. You feel dizzy, tiredness (fatigue), or off-balance. Take over-the-counter and prescription medicines only as told by your health care provider. These include supplements. Eat a healthy diet and maintain a healthy weight. A healthy diet includes low-fat dairy products, low-fat (lean) meats, and fiber from whole grains, beans, and lots of fruits and vegetables. Stay current with your vaccines. Schedule regular health, dental, and eye exams. Summary Having a healthy lifestyle and getting preventive care can help to protect your health and wellness after age 2. Screening and testing are the best way to find a health problem early and help you avoid having a fall. Early diagnosis and treatment give you the best chance for managing medical conditions that are more common for people who are older than age 82. Falls are a major cause of broken bones and head injuries in people who are older than age 28. Take precautions to prevent a fall at home. Work with your health care provider to learn what changes you can make to improve your health and wellness and to prevent falls. This information is not intended to replace advice given  to you by your health care provider. Make sure you discuss any questions you have with your health care provider. Document Revised: 05/05/2021 Document Reviewed: 05/05/2021 Elsevier Patient Education  2024 [employer].

## 2024-09-25 ENCOUNTER — Encounter: Payer: Self-pay | Admitting: Pulmonary Disease

## 2024-09-26 ENCOUNTER — Encounter

## 2024-09-27 ENCOUNTER — Other Ambulatory Visit: Payer: Self-pay | Admitting: Pulmonary Disease

## 2024-09-28 ENCOUNTER — Encounter

## 2024-10-03 ENCOUNTER — Other Ambulatory Visit: Payer: Self-pay | Admitting: Internal Medicine

## 2024-10-03 ENCOUNTER — Encounter

## 2024-10-03 DIAGNOSIS — E782 Mixed hyperlipidemia: Secondary | ICD-10-CM

## 2024-10-05 ENCOUNTER — Encounter

## 2024-10-05 NOTE — Telephone Encounter (Signed)
 Requested Prescriptions  Pending Prescriptions Disp Refills   rosuvastatin  (CRESTOR ) 5 MG tablet [Pharmacy Med Name: ROSUVASTATIN  CALCIUM  5 MG TAB] 90 tablet 0    Sig: TAKE 1 TABLET (5 MG TOTAL) BY MOUTH DAILY.     Cardiovascular:  Antilipid - Statins 2 Failed - 10/05/2024  9:15 AM      Failed - Lipid Panel in normal range within the last 12 months    Cholesterol, Total  Date Value Ref Range Status  01/13/2024 177 100 - 199 mg/dL Final   LDL Chol Calc (NIH)  Date Value Ref Range Status  01/13/2024 101 (H) 0 - 99 mg/dL Final   HDL  Date Value Ref Range Status  01/13/2024 59 >39 mg/dL Final   Triglycerides  Date Value Ref Range Status  01/13/2024 95 0 - 149 mg/dL Final         Passed - Cr in normal range and within 360 days    Creatinine, Ser  Date Value Ref Range Status  01/13/2024 0.83 0.57 - 1.00 mg/dL Final         Passed - Patient is not pregnant      Passed - Valid encounter within last 12 months    Recent Outpatient Visits           1 week ago Major depression single episode, in partial remission   [facility] at [facility], Leita DEL, MD

## 2024-10-09 ENCOUNTER — Ambulatory Visit: Payer: Self-pay

## 2024-10-09 NOTE — Telephone Encounter (Addendum)
 FYI Only or Action Required?: Action required by provider: update on patient condition.  Patient is followed in Pulmonology  last seen on 09/11/2024 by Tamea Dedra CROME, MD.  Called Nurse Triage reporting Cough and Covid Positive.  Symptoms began several days ago.  Interventions attempted: OTC medications: Aleve.  Symptoms are: unchanged.  Triage Disposition: Call PCP Within 24 Hours  Patient/caregiver understands and will follow disposition?: Unsure  **See note below**      Copied from CRM #[id number]. Topic: Clinical - Red Word Triage >> Oct 09, 2024 12:13 PM Russell PARAS wrote: Red Word that prompted transfer to Nurse Triage:   Tested positive COVID last week Has severe cough Phlegm is yellowish green, occasionally clear No SOB and wheezing  Pt of Tamea Reason for Disposition  [1] HIGH RISK patient (e.g., weak immune system, 65 years and older, obesity with BMI 30 or higher, pregnant, chronic lung disease) AND [2] COVID symptoms (e.g., cough, fever)  (Exceptions: Already seen by PCP and no new or worsening symptoms.)  Answer Assessment - Initial Assessment Questions 1. ONSET: When did the cough begin?      Last Thursday   2. SEVERITY: How bad is the cough today?      Intermittent cough  3. SPUTUM: Describe the color of your sputum (e.g., none, dry cough; clear, white, yellow, green)     yellowish green  4. HEMOPTYSIS: Are you coughing up any blood? If Yes, ask: How much? (e.g., flecks, streaks, tablespoons, etc.)     No   5. DIFFICULTY BREATHING: Are you having difficulty breathing? If Yes, ask: How bad is it? (e.g., mild, moderate, severe)      No   6. FEVER: Do you have a fever? If Yes, ask: What is your temperature, how was it measured, and when did it start?     Yes, low grade fever   7. CARDIAC HISTORY: Do you have any history of heart disease? (e.g., heart attack, congestive heart failure)      No   8. LUNG HISTORY: Do you have any  history of lung disease?  (e.g., pulmonary embolus, asthma, emphysema)     COPD  9. PE RISK FACTORS: Do you have a history of blood clots? (or: recent major surgery, recent prolonged travel, bedridden)     No   10. OTHER SYMPTOMS: Do you have any other symptoms? (e.g., runny nose, wheezing, chest pain)       Fatigue   Pt. Tested positive for Covid last week. For home care patient has taken Aleve. Patient declined appt. As she only wishes to see Dr. Lenda which that provider has no availability until December. She stated she will tough out the symptoms a little longer and will call back if she needs to.  Protocols used: Cough - Acute Productive-A-AH, COVID-19 - Diagnosed or Suspected-A-AH

## 2024-10-09 NOTE — Telephone Encounter (Signed)
 Noted. Nothing further needed.

## 2024-10-10 ENCOUNTER — Encounter

## 2024-10-11 ENCOUNTER — Encounter: Payer: Self-pay | Admitting: Pulmonary Disease

## 2024-10-11 DIAGNOSIS — J209 Acute bronchitis, unspecified: Secondary | ICD-10-CM

## 2024-10-11 MED ORDER — AZITHROMYCIN 250 MG PO TABS: ORAL_TABLET | ORAL | 0 refills | Status: DC

## 2024-10-11 MED ORDER — METHYLPREDNISOLONE 4 MG PO TBPK: ORAL_TABLET | ORAL | 0 refills | Status: DC

## 2024-10-12 ENCOUNTER — Encounter

## 2024-10-17 ENCOUNTER — Encounter

## 2024-10-19 ENCOUNTER — Encounter

## 2024-10-24 ENCOUNTER — Encounter

## 2024-10-26 ENCOUNTER — Ambulatory Visit

## 2024-10-31 ENCOUNTER — Encounter

## 2024-11-02 ENCOUNTER — Ambulatory Visit

## 2024-11-07 ENCOUNTER — Encounter

## 2024-11-09 ENCOUNTER — Ambulatory Visit

## 2024-11-14 ENCOUNTER — Ambulatory Visit

## 2024-11-14 ENCOUNTER — Other Ambulatory Visit: Payer: Self-pay | Admitting: Internal Medicine

## 2024-11-14 DIAGNOSIS — F324 Major depressive disorder, single episode, in partial remission: Secondary | ICD-10-CM

## 2024-11-16 ENCOUNTER — Ambulatory Visit

## 2024-11-16 ENCOUNTER — Other Ambulatory Visit: Payer: Self-pay

## 2024-11-16 ENCOUNTER — Encounter: Payer: Self-pay | Admitting: Internal Medicine

## 2024-11-21 ENCOUNTER — Encounter

## 2024-11-22 ENCOUNTER — Encounter: Payer: Self-pay | Admitting: Pulmonary Disease

## 2024-11-22 DIAGNOSIS — J209 Acute bronchitis, unspecified: Secondary | ICD-10-CM

## 2024-11-24 MED ORDER — METHYLPREDNISOLONE 4 MG PO TBPK: ORAL_TABLET | ORAL | 0 refills | Status: DC

## 2024-11-24 MED ORDER — PROMETHAZINE-DM 6.25-15 MG/5ML PO SYRP: 5.0000 mL | ORAL_SOLUTION | Freq: Four times a day (QID) | ORAL | 0 refills | Status: AC | PRN

## 2024-11-24 MED ORDER — AMOXICILLIN-POT CLAVULANATE 875-125 MG PO TABS: 1.0000 | ORAL_TABLET | Freq: Two times a day (BID) | ORAL | 0 refills | Status: DC

## 2024-11-24 NOTE — Telephone Encounter (Signed)
 Spoke to patient, in no distress but sputum becoming purulent. Symptoms consistent with acute bronchitis in COPD. Prescriptions sent to [facility]. Instructed to go to ED if worsens. Call Monday if no better.

## 2024-11-27 NOTE — Telephone Encounter (Signed)
 Noted. Nothing further needed.

## 2024-11-29 ENCOUNTER — Ambulatory Visit: Admitting: Pulmonary Disease

## 2024-12-04 ENCOUNTER — Encounter: Payer: Self-pay | Admitting: Pulmonary Disease

## 2024-12-04 ENCOUNTER — Ambulatory Visit: Admitting: Pulmonary Disease

## 2024-12-04 VITALS — BP 102/70 | HR 94 | Temp 97.7°F | Ht 62.5 in | Wt 134.4 lb

## 2024-12-04 DIAGNOSIS — J449 Chronic obstructive pulmonary disease, unspecified: Secondary | ICD-10-CM

## 2024-12-04 DIAGNOSIS — J4489 Other specified chronic obstructive pulmonary disease: Secondary | ICD-10-CM

## 2024-12-04 DIAGNOSIS — B379 Candidiasis, unspecified: Secondary | ICD-10-CM

## 2024-12-04 DIAGNOSIS — F17210 Nicotine dependence, cigarettes, uncomplicated: Secondary | ICD-10-CM

## 2024-12-04 DIAGNOSIS — R918 Other nonspecific abnormal finding of lung field: Secondary | ICD-10-CM

## 2024-12-04 DIAGNOSIS — J439 Emphysema, unspecified: Secondary | ICD-10-CM

## 2024-12-04 MED ORDER — TRELEGY ELLIPTA 200-62.5-25 MCG/ACT IN AEPB: 1.0000 | INHALATION_SPRAY | Freq: Every day | RESPIRATORY_TRACT | 11 refills | Status: AC

## 2024-12-04 MED ORDER — ROFLUMILAST 500 MCG PO TABS: 500.0000 ug | ORAL_TABLET | Freq: Every day | ORAL | 6 refills | Status: AC

## 2024-12-04 MED ORDER — FLUCONAZOLE 150 MG PO TABS: 150.0000 mg | ORAL_TABLET | Freq: Once | ORAL | 0 refills | Status: AC

## 2024-12-04 NOTE — Patient Instructions (Signed)
 VISIT SUMMARY:  During today's visit, we discussed your ongoing COPD symptoms, including your persistent cough and sputum production. We also addressed your recent yeast infection, likely caused by previous antibiotic use, and your continued smoking habit. We reviewed your current medications and made some adjustments to better manage your symptoms.  YOUR PLAN:  -CHRONIC OBSTRUCTIVE PULMONARY DISEASE (COPD): COPD is a chronic lung disease that causes obstructed airflow from the lungs, leading to breathing difficulties. We have prescribed Daliresp , which you should take every other day for the first week and then daily. Additionally, you have a printed prescription for Trelegy. It's important to quit smoking to help manage your COPD, and we discussed smoking cessation programs that can support you in this effort.  -TOBACCO USE DISORDER: Tobacco use disorder is a dependence on tobacco products, which is harmful to your health, especially with COPD. You continue to smoke about one pack per day and have found nicotine  patches ineffective. We strongly encourage you to participate in smoking cessation programs to help you quit smoking.  -CANDIDIASIS, UNSPECIFIED SITE: Candidiasis is a yeast infection that can occur in various parts of the body. Your recent yeast infection is likely due to the antibiotics you were taking. We have prescribed medication to treat this infection.  -COUGH: Your persistent cough with sputum production is likely related to your COPD and inflammation in your airways. Continue taking Daliresp  as prescribed to help manage this symptom.  INSTRUCTIONS:  Please follow the prescribed medication regimen for Daliresp  and Trelegy. Take Daliresp  every other day for the first week, then daily. Use the medication prescribed for your yeast infection as directed. Participate in smoking cessation programs to help quit smoking. If you experience any new or worsening symptoms, please schedule a  follow-up appointment.

## 2024-12-04 NOTE — Progress Notes (Unsigned)
 Subjective:    Patient ID: Lisa Avila, female    DOB: 01/12/1956, 68 y.o.   MRN: [id number]  Patient Care Team: Justus Leita DEL, MD as PCP - General (Internal Medicine) Tamea Dedra CROME, MD as Consulting Physician (Pulmonary Disease)  Chief Complaint  Patient presents with  . COPD    Cough with thick green phlegm. Completed Augmentin ,  Zpak and prednisone . Using trelegy daily. Shortness of breath on exertion. Occasional wheezing.     BACKGROUND/INTERVAL:  HPI   Review of Systems A 10 point review of systems was performed and it is as noted above otherwise negative.   Patient Active Problem List   Diagnosis Date Noted  . Mixed hyperlipidemia 08/27/2023  . Adenomatous polyp of colon 05/25/2023  . Restless leg syndrome 05/10/2023  . Major depression single episode, in partial remission 05/10/2023  . Nocturnal hypoxemia due to emphysema (HCC) 05/10/2023  . Stage 3 severe COPD by GOLD classification (HCC) 02/26/2014  . Severe tobacco use disorder 02/26/2014    Social History   Tobacco Use  . Smoking status: Every Day    Current packs/day: 1.00    Average packs/day: 2.0 packs/day for 52.9 years (104.4 ttl pk-yrs)    Types: Cigarettes    Start date: 2  . Smokeless tobacco: Never  . Tobacco comments:          Substance Use Topics  . Alcohol use: No    Alcohol/week: 0.0 standard drinks of alcohol    No Known Allergies  Current Meds  Medication Sig  . albuterol  (PROVENTIL ) (2.5 MG/3ML) 0.083% nebulizer solution Take 3 mLs (2.5 mg total) by nebulization every 6 (six) hours as needed for wheezing or shortness of breath.  . albuterol  (VENTOLIN  HFA) 108 (90 Base) MCG/ACT inhaler TAKE 2 PUFFS BY MOUTH EVERY 6 HOURS AS NEEDED FOR WHEEZE OR SHORTNESS OF BREATH  . cyclobenzaprine (FLEXERIL) 10 MG tablet Take 10 mg by mouth 3 (three) times daily as needed.  SABRA esomeprazole (NEXIUM) 20 MG capsule Take 20 mg by mouth daily at 12 noon.  . OXYGEN  Inhale 2.5 L into the  lungs at bedtime.  . promethazine -dextromethorphan (PROMETHAZINE -DM) 6.25-15 MG/5ML syrup Take 5 mLs by mouth 4 (four) times daily as needed for cough.  SABRA rOPINIRole (REQUIP) 0.5 MG tablet Take 0.5 tablets by mouth as needed.  . rosuvastatin  (CRESTOR ) 5 MG tablet TAKE 1 TABLET (5 MG TOTAL) BY MOUTH DAILY.  SABRA Spacer/Aero-Holding Chambers (AEROCHAMBER MV) inhaler Use as instructed to aid with inhaled medications.  . TRELEGY ELLIPTA  200-62.5-25 MCG/ACT AEPB TAKE 1 PUFF BY MOUTH EVERY DAY    Immunization History  Administered Date(s) Administered  . Fluad Trivalent(High Dose 65+) 10/27/2023  . INFLUENZA, HIGH DOSE SEASONAL PF 09/22/2024  . Influenza Split 10/29/2013, 11/13/2014  . Influenza,inj,Quad PF,6+ Mos 10/03/2018, 10/15/2020, 10/08/2022  . Influenza-Unspecified 10/09/2019, 10/22/2021  . PFIZER(Purple Top)SARS-COV-2 Vaccination 07/13/2020, 08/03/2020  . PNEUMOCOCCAL CONJUGATE-20 08/26/2023  . Pneumococcal Conjugate-13 11/13/2014  . Tdap 08/11/2016        Objective:     Vitals:   12/04/24 1123  BP: 102/70  Pulse: 94  Temp: 97.7 F (36.5 C)  Height: 5' 2.5 (1.588 m)  Weight: 134 lb 6.4 oz (61 kg)  SpO2: 95%  TempSrc: Temporal  BMI (Calculated): 24.17     SpO2: 95 %  GENERAL: HEAD: Normocephalic, atraumatic.  EYES: Pupils equal, round, reactive to light.  No scleral icterus.  MOUTH:  NECK: Supple. No thyromegaly. Trachea midline. No JVD.  No adenopathy.  PULMONARY: Good air entry bilaterally.  No adventitious sounds. CARDIOVASCULAR: S1 and S2. Regular rate and rhythm.  ABDOMEN: MUSCULOSKELETAL: No joint deformity, no clubbing, no edema.  NEUROLOGIC:  SKIN: Intact,warm,dry. PSYCH:        Assessment & Plan:   No diagnosis found.  No orders of the defined types were placed in this encounter.   No orders of the defined types were placed in this encounter.     Advised if symptoms do not improve or worsen, to please contact office for sooner follow up or  seek emergency care.    I spent xxx minutes of dedicated to the care of this patient on the date of this encounter to include pre-visit review of records, face-to-face time with the patient discussing conditions above, post visit ordering of testing, clinical documentation with the electronic health record, making appropriate referrals as documented, and communicating necessary findings to members of the patients care team.     C. Leita Sanders, MD [facility]  [facility]    *This note was generated using voice recognition software/Dragon and/or AI transcription program.  Despite best efforts to proofread, errors can occur which can change the meaning. Any transcriptional errors that result from this process are unintentional and may not be fully corrected at the time of dictation.

## 2024-12-05 ENCOUNTER — Other Ambulatory Visit: Payer: Self-pay

## 2024-12-05 DIAGNOSIS — Z87891 Personal history of nicotine dependence: Secondary | ICD-10-CM

## 2024-12-05 DIAGNOSIS — Z122 Encounter for screening for malignant neoplasm of respiratory organs: Secondary | ICD-10-CM

## 2024-12-05 DIAGNOSIS — F17210 Nicotine dependence, cigarettes, uncomplicated: Secondary | ICD-10-CM

## 2025-01-08 ENCOUNTER — Telehealth: Payer: Self-pay

## 2025-01-08 ENCOUNTER — Other Ambulatory Visit: Payer: Self-pay

## 2025-01-08 DIAGNOSIS — E782 Mixed hyperlipidemia: Secondary | ICD-10-CM

## 2025-01-08 MED ORDER — ROSUVASTATIN CALCIUM 5 MG PO TABS: 5.0000 mg | ORAL_TABLET | Freq: Every day | ORAL | 0 refills | Status: AC

## 2025-01-08 NOTE — Telephone Encounter (Signed)
 Left voice mail to set up TOC

## 2025-01-08 NOTE — Telephone Encounter (Signed)
 Noted  KP

## 2025-01-31 ENCOUNTER — Encounter: Payer: Self-pay | Admitting: Pulmonary Disease

## 2025-02-01 MED ORDER — AMOXICILLIN-POT CLAVULANATE 875-125 MG PO TABS: 1.0000 | ORAL_TABLET | Freq: Two times a day (BID) | ORAL | 0 refills | Status: AC

## 2025-02-01 MED ORDER — METHYLPREDNISOLONE 4 MG PO TBPK: ORAL_TABLET | ORAL | 0 refills | Status: AC

## 2025-02-01 NOTE — Telephone Encounter (Signed)
 She needs to quit smoking first and foremost.  Otherwise she will continue to have cough and be prone to repeated infections.  Will send antibiotic and Medrol  Dosepak to pharmacy.  She can take Mucinex DM maximum strength twice a day with plenty of water .  We can move her appointment up from March.  If she fails to respond to this she needs to be seen at either urgent care or ED.

## 2025-02-26 ENCOUNTER — Encounter: Admitting: Student

## 2025-03-06 ENCOUNTER — Ambulatory Visit: Admitting: Pulmonary Disease
